# Patient Record
Sex: Female | Born: 1970 | Race: Black or African American | Hispanic: No | Marital: Single | Smoking: Current every day smoker
Health system: Southern US, Community
[De-identification: ages and names within clinical notes are randomized; demographics above are authoritative.]

## PROBLEM LIST (undated history)

## (undated) DIAGNOSIS — B019 Varicella without complication: Secondary | ICD-10-CM

## (undated) DIAGNOSIS — N841 Polyp of cervix uteri: Secondary | ICD-10-CM

## (undated) DIAGNOSIS — D649 Anemia, unspecified: Secondary | ICD-10-CM

## (undated) DIAGNOSIS — K921 Melena: Secondary | ICD-10-CM

## (undated) DIAGNOSIS — N920 Excessive and frequent menstruation with regular cycle: Secondary | ICD-10-CM

## (undated) DIAGNOSIS — E282 Polycystic ovarian syndrome: Secondary | ICD-10-CM

## (undated) DIAGNOSIS — N840 Polyp of corpus uteri: Secondary | ICD-10-CM

## (undated) HISTORY — PX: OTHER SURGICAL HISTORY: SHX169

## (undated) HISTORY — DX: Polycystic ovarian syndrome: E28.2

## (undated) HISTORY — DX: Melena: K92.1

## (undated) HISTORY — DX: Polyp of cervix uteri: N84.1

## (undated) HISTORY — DX: Polyp of corpus uteri: N84.0

## (undated) HISTORY — DX: Anemia, unspecified: D64.9

## (undated) HISTORY — DX: Excessive and frequent menstruation with regular cycle: N92.0

## (undated) HISTORY — DX: Varicella without complication: B01.9

## (undated) HISTORY — DX: Morbid (severe) obesity due to excess calories: E66.01

---

## 2000-09-17 ENCOUNTER — Encounter: Payer: Self-pay | Admitting: Emergency Medicine

## 2000-09-17 ENCOUNTER — Emergency Department (HOSPITAL_COMMUNITY): Admission: EM | Admit: 2000-09-17 | Discharge: 2000-09-17 | Payer: Self-pay | Admitting: Emergency Medicine

## 2000-10-23 ENCOUNTER — Encounter: Admission: RE | Admit: 2000-10-23 | Discharge: 2001-01-21 | Payer: Self-pay | Admitting: Family Medicine

## 2000-12-08 ENCOUNTER — Encounter: Payer: Self-pay | Admitting: Emergency Medicine

## 2000-12-08 ENCOUNTER — Emergency Department (HOSPITAL_COMMUNITY): Admission: EM | Admit: 2000-12-08 | Discharge: 2000-12-08 | Payer: Self-pay | Admitting: Emergency Medicine

## 2001-02-26 ENCOUNTER — Other Ambulatory Visit: Admission: RE | Admit: 2001-02-26 | Discharge: 2001-02-26 | Payer: Self-pay | Admitting: *Deleted

## 2001-12-18 ENCOUNTER — Other Ambulatory Visit: Admission: RE | Admit: 2001-12-18 | Discharge: 2001-12-18 | Payer: Self-pay | Admitting: *Deleted

## 2002-02-12 ENCOUNTER — Ambulatory Visit (HOSPITAL_COMMUNITY): Admission: RE | Admit: 2002-02-12 | Discharge: 2002-02-12 | Payer: Self-pay | Admitting: *Deleted

## 2002-02-12 ENCOUNTER — Encounter (INDEPENDENT_AMBULATORY_CARE_PROVIDER_SITE_OTHER): Payer: Self-pay

## 2004-03-19 ENCOUNTER — Other Ambulatory Visit: Admission: RE | Admit: 2004-03-19 | Discharge: 2004-03-19 | Payer: Self-pay | Admitting: Gynecology

## 2004-05-11 ENCOUNTER — Ambulatory Visit (HOSPITAL_COMMUNITY): Admission: RE | Admit: 2004-05-11 | Discharge: 2004-05-11 | Payer: Self-pay | Admitting: Obstetrics and Gynecology

## 2004-05-11 ENCOUNTER — Encounter (INDEPENDENT_AMBULATORY_CARE_PROVIDER_SITE_OTHER): Payer: Self-pay | Admitting: *Deleted

## 2005-03-29 ENCOUNTER — Other Ambulatory Visit: Admission: RE | Admit: 2005-03-29 | Discharge: 2005-03-29 | Payer: Self-pay | Admitting: Obstetrics and Gynecology

## 2006-04-11 ENCOUNTER — Other Ambulatory Visit: Admission: RE | Admit: 2006-04-11 | Discharge: 2006-04-11 | Payer: Self-pay | Admitting: Obstetrics and Gynecology

## 2008-03-31 ENCOUNTER — Emergency Department (HOSPITAL_COMMUNITY): Admission: EM | Admit: 2008-03-31 | Discharge: 2008-03-31 | Payer: Self-pay | Admitting: Emergency Medicine

## 2009-03-01 ENCOUNTER — Emergency Department (HOSPITAL_COMMUNITY): Admission: EM | Admit: 2009-03-01 | Discharge: 2009-03-01 | Payer: Self-pay | Admitting: Emergency Medicine

## 2011-02-22 NOTE — Op Note (Signed)
[facility]  Patient:    [name], DOUBLE Visit Number: [id number] MRN: [id number]          Service Type: DSU Location: [facility] Attending Physician:  Wetzel Bjornstad Dictated by:   Katy Fitch, M.D. Proc. Date: 02/12/02 Admit Date:  02/12/2002 Discharge Date: 02/12/2002                             Operative Report  PREOPERATIVE DIAGNOSES:       1. Menorrhagia.                               2. Endometrial defect.  POSTOPERATIVE DIAGNOSES:      1. Menorrhagia.                               2. Endometrial defect.                               3. Uterine polyps.  PROCEDURE:                    1. Hysteroscopic resection of uterine polyps.                               2. Suction dilatation and curettage.  SURGEON:                      Katy Fitch, M.D.  ASSIST:                       Gaetano Hawthorne. Lily Peer, M.D.  ANESTHESIA:                   General.  ESTIMATED BLOOD LOSS:         Minimal.  URINE OUTPUT:                 100.  FLUIDS:                       800 Crystalloid.  Hysteroscopic fluid deficit 500 with most being on the floor.  COMPLICATIONS:                None.  SPECIMEN:                     1. Uterine polyps.                               2. Endometrial curettings.  FINDINGS:                     Uterus sounded approximately 7 cm with a markedly angled cervix.  Two anterior polyps approximately 0.5 cm in diameter each and one posterior polyp similar size.  PROCEDURE:                    Patient was taken to the operating room where general anesthesia was administered.  The patient was placed in [place] stirrups and prepped and draped in normal sterile fashion.  A Red rubber catheter was introduced in the bladder and 100 cc of clear urine  was returned.  A bivalve speculum was placed in the vagina, then the anterior lip of the cervix was grasped with a single tooth tenaculum.  The previous laminaria was then removed and the cervix  was then serially dilated to approximately 31 [place]. After several attempts of placing the hysteroscope into the cervix there was noted to be an angle at the cervix which was difficult to navigate.  At this point I asked Dr. Lily Peer to try to help manipulate the cervix and get the scope into the uterine fundus.  At this point he was able to maneuver the scope into the uterine fundus without any difficulties or perforation.  At this point the fluid was placed at 80 cc/minute which was Sorbitol.  At this point the cavity was noted to be cleared of blood and the uterine polyps were then noted, two on the anterior surface and one on the posterior surface. With a 90 degree wire loop at an 80 cut the two anterior polyps were removed and the resectoscope was then removed from the field of operation and specimens placed on Telfa paper.  The scope was then placed back into the uterine fundus and the posterior polyp was then removed with the 90 degree wire loop and hemostasis was noted.  After this procedure the patient had a sharp curette of the endometrial cavity and the rest of the uterine contents were suctioned with a #7 straight catheter tip and sent as a separate specimen.  Hemostasis was noted and the single tooth tenaculum was removed and the patient was then taken to recovery room in stable condition. Dictated by:   Katy Fitch, M.D. Attending Physician:  Wetzel Bjornstad DD:  02/12/02 TD:  02/15/02 Job: [id number]

## 2011-02-22 NOTE — H&P (Signed)
[facility]  PatientVIOLETA, [name] Visit Number: [id number] MRN: [id number]          Service Type: Attending:  Katy Fitch, M.D. Dictated by:   Katy Fitch, M.D. Adm. Date:  02/11/02                           History and Physical  CHIEF COMPLAINT:              Abnormal uterine bleeding.  HISTORY OF PRESENT ILLNESS:   The patient is a 40 year old, G0, who showed up to the office on December 18, 2001, for annual examination and also complained of heavy vaginal bleeding.  The patient states that she started her periods at age 47 and had irregular bleeding for the first six years.  The patient denied any other problems and has not been on any type of birth control.  The patient underwent endometrial biopsy in the office showing a benign endometrial polyp and strips of benign endometrium.  A saline infusion sonogram showed anterior and posterior defects in the endometrial cavity suggestive of uterine polyps. The patient was then put on Lupron Depot to thin her lining and schedule for resection of uterine polyps.  PAST MEDICAL HISTORY:         None.  PAST SURGICAL HISTORY:        None.  MEDICATIONS:                  None.  ALLERGIES:                    No known drug allergies.  SOCIAL HISTORY:               Without any tobacco, alcohol, or drugs.  FAMILY HISTORY:               Without any mental retardation or epithelial cancers.  PHYSICAL EXAMINATION:         Blood pressure 130/70.  HEENT:                        Throat clear.  LUNGS:                        Clear to auscultation bilaterally.  HEART:                        Regular rate and rhythm.  ABDOMEN:                      Obese and nontender.  No palpable masses.  EXTREMITIES:                  Without any clubbing, cyanosis, edema, or tenderness.  PELVIC:                       Normal external genitalia.  Cervix without any gross lesions.  Uterus and adnexa difficult to  palpate secondary to size.  ASSESSMENT AND PLAN:          A 40 year old G0 with menorrhagia and probable endometrial polyps.  The patient is scheduled for endometrial resection of uterine polyps through a hysteroscope.  The patient was given laminaria on Feb 11, 2002, for mechanical dilation of her cervix.  The patient was explained the risks and benefits of the procedure, including bleeding,  transfusion, hepatitis, HIV, scar tissue, scar tissue on the inside lining leading to potential infertility, damage to internal organs, potential uterine perforation which would need possible further exploratory surgery to fix the damage, fistula formation, anesthetic complications, blood clots, stroke, pulmonary embolism, myocardial infarction, or death.  Also, the patient was explained that she could also developed electrolyte abnormalities due to the fluid absorption of the hysteroscope which could also potentially lead to seizures.  The patient states that she understands these risks and all of her questions were answered to her satisfaction. Dictated by:   Katy Fitch, M.D. Attending:  Katy Fitch, M.D. DD:  02/11/02 TD:  02/11/02 Job: [id number]

## 2011-02-22 NOTE — Op Note (Signed)
[name], [name]                            ACCOUNT NO.:  [id number]   MEDICAL RECORD NO.:  [id number]                   PATIENT TYPE:  AMB   LOCATION:  SDC                                  FACILITY:  [facility]   PHYSICIAN:  Daniel L. Eda Paschal, M.D.           DATE OF BIRTH:  Jul 13, 1971   DATE OF PROCEDURE:  05/11/2004  DATE OF DISCHARGE:  05/11/2004                                 OPERATIVE REPORT   PREOPERATIVE DIAGNOSIS:  Menorrhalgia with endometrial polyp.   POSTOPERATIVE DIAGNOSIS:  Menorrhagia with endometrial polyp.   OPERATION:  Hysteroscopy, dilatation and curettage, removal of polyp.   SURGEON:  Daniel L. Eda Paschal, M.D.   ANESTHESIA:  General.   INDICATIONS:  This patient is a 40 year old gravida 0, para 0, who had  presented to the office with severe menorrhagia.  Bleeding was finally  stopped with Progestin, but SIH was done because of a history of a previous  endometrial polyp several years ago and on SIH she had multiple small  filling defects in her uterus.  She now enters the hospital for  hysteroscopy, D&C.   PROCEDURE:  The patient was taken to the operating room and given general  anesthesia.  At one point she had a laryngospasm.  Her anesthesia was  finally controlled and then she was placed in the dorsal lithotomy position  and prepped and draped in the usual sterile manner.  A single-tooth  tenaculum was placed on the anterior lip of the cervix.  The cervix could be  with a lot of difficulty dilated to a #1 Pratt dilator.  The reason there  was difficulty was because of the anatomy due to her heaviness, she weighed  over 400 pounds and difficulty in terms of navigating through her cervix  into the uterus.  After this was done, an attempt was placed with the  hysteroscope but because of the long length of her vagina and the size of  the resectoscope, you could not get a resectoscope through the cervical  canal in a nontraumatic fashion.  Therefore a  diagnostic hysteroscope was  used, 3% sorbitol was used to expand the intrauterine cavity.  The patient  had multiple small polyps and a large build-up of endometrium __________,  but no large defects were seen, just the multiple defects described above.  The hysteroscope was removed.  Curettage was done with three different-sized  curettes.  Three or four polypoid-type segments were removed along with all  the endometrium.  She was re-hysteroscoped and she now had a clean  endometrial cavity.  The procedure was terminated.  All tissue was sent to  pathology for tissue diagnosis.  Estimated blood loss was 30 mL with none  replaced.  Fluid deficit was 20 mL of the sorbitol.  Please note that a  camera had been used with the hysteroscope.  The patient left the operating  room in satisfactory condition.  Daniel L. Eda Paschal, M.D.    Tonette Bihari  D:  05/11/2004  T:  05/13/2004  Job:  [id number]

## 2011-04-01 ENCOUNTER — Encounter (INDEPENDENT_AMBULATORY_CARE_PROVIDER_SITE_OTHER): Payer: 59 | Admitting: Women's Health

## 2011-04-01 DIAGNOSIS — N912 Amenorrhea, unspecified: Secondary | ICD-10-CM

## 2011-04-01 DIAGNOSIS — Z01419 Encounter for gynecological examination (general) (routine) without abnormal findings: Secondary | ICD-10-CM

## 2011-04-01 DIAGNOSIS — Z833 Family history of diabetes mellitus: Secondary | ICD-10-CM

## 2011-04-01 DIAGNOSIS — N926 Irregular menstruation, unspecified: Secondary | ICD-10-CM

## 2011-04-17 ENCOUNTER — Other Ambulatory Visit: Payer: 59

## 2011-04-17 ENCOUNTER — Ambulatory Visit: Payer: 59 | Admitting: Women's Health

## 2011-04-22 ENCOUNTER — Other Ambulatory Visit: Payer: 59

## 2011-04-22 ENCOUNTER — Ambulatory Visit: Payer: 59 | Admitting: Women's Health

## 2011-04-24 ENCOUNTER — Other Ambulatory Visit: Payer: 59

## 2011-04-24 ENCOUNTER — Ambulatory Visit (INDEPENDENT_AMBULATORY_CARE_PROVIDER_SITE_OTHER): Payer: 59 | Admitting: Women's Health

## 2011-04-24 DIAGNOSIS — E282 Polycystic ovarian syndrome: Secondary | ICD-10-CM

## 2011-04-24 DIAGNOSIS — L909 Atrophic disorder of skin, unspecified: Secondary | ICD-10-CM

## 2011-04-24 DIAGNOSIS — L919 Hypertrophic disorder of the skin, unspecified: Secondary | ICD-10-CM

## 2011-04-24 DIAGNOSIS — N912 Amenorrhea, unspecified: Secondary | ICD-10-CM

## 2011-07-04 LAB — CBC
HCT: 42.7
Hemoglobin: 14.1
MCHC: 33.1
MCV: 79
Platelets: 246
RBC: 5.4 — ABNORMAL HIGH
RDW: 16.7 — ABNORMAL HIGH
WBC: 12 — ABNORMAL HIGH

## 2011-07-04 LAB — POCT I-STAT, CHEM 8
BUN: 14
Calcium, Ion: 1.12
Chloride: 105
Creatinine, Ser: 1
Glucose, Bld: 83
HCT: 45
Hemoglobin: 15.3 — ABNORMAL HIGH
Potassium: 4.1
Sodium: 138
TCO2: 26

## 2011-07-04 LAB — DIFFERENTIAL
Basophils Absolute: 0
Basophils Relative: 0
Eosinophils Absolute: 0.4
Eosinophils Relative: 3
Lymphocytes Relative: 21
Lymphs Abs: 2.6
Monocytes Absolute: 0.7
Monocytes Relative: 6
Neutro Abs: 8.3 — ABNORMAL HIGH
Neutrophils Relative %: 69

## 2011-07-04 LAB — SAMPLE TO BLOOD BANK

## 2011-07-04 LAB — OCCULT BLOOD X 1 CARD TO LAB, STOOL: Fecal Occult Bld: POSITIVE

## 2011-08-19 ENCOUNTER — Encounter: Payer: Self-pay | Admitting: Family Medicine

## 2011-08-19 ENCOUNTER — Ambulatory Visit (INDEPENDENT_AMBULATORY_CARE_PROVIDER_SITE_OTHER): Payer: 59 | Admitting: Family Medicine

## 2011-08-19 ENCOUNTER — Telehealth: Payer: Self-pay | Admitting: *Deleted

## 2011-08-19 VITALS — BP 110/65 | HR 73 | Temp 98.7°F | Ht 64.5 in | Wt 386.6 lb

## 2011-08-19 DIAGNOSIS — R519 Headache, unspecified: Secondary | ICD-10-CM | POA: Insufficient documentation

## 2011-08-19 DIAGNOSIS — R51 Headache: Secondary | ICD-10-CM | POA: Insufficient documentation

## 2011-08-19 NOTE — Telephone Encounter (Signed)
Error

## 2011-08-19 NOTE — Progress Notes (Signed)
  Subjective:    Patient ID: [name], female    DOB: [birth year], 40 y.o.   MRN: [id number]  HPI New to establish.  Previous MD- Allyne Gee, was dismissed due to no shows.  GYN- [facility], [facility].  HAs- pt reports HAs started 'all of a sudden' at work.  Took ibuprofen and sxs improved.  No relief w/ tylenol.  HAs occuring daily until appt was made on 11/6- has not had one since.  sxs started 3 weeks ago.  HAs were frontal.  No nausea, visual changes, dizziness.  Denies nasal congestion.  No hx of seasonal allergies.  No ear pain.  No hx of similar.   Review of Systems For ROS see HPI     Objective:   Physical Exam  Vitals reviewed. Constitutional: She is oriented to person, place, and time. She appears well-developed and well-nourished. No distress.  HENT:  Head: Normocephalic and atraumatic.  Right Ear: Tympanic membrane normal.  Left Ear: Tympanic membrane normal.  Nose: Mucosal edema and rhinorrhea present. Right sinus exhibits no maxillary sinus tenderness and no frontal sinus tenderness. Left sinus exhibits no maxillary sinus tenderness and no frontal sinus tenderness.  Mouth/Throat: Mucous membranes are normal. Posterior oropharyngeal erythema (w/ PND) present.  Eyes: Conjunctivae and EOM are normal. Pupils are equal, round, and reactive to light.  Neck: Normal range of motion. Neck supple.  Cardiovascular: Normal rate, regular rhythm and normal heart sounds.   Pulmonary/Chest: Effort normal and breath sounds normal. No respiratory distress. She has no wheezes. She has no rales.  Lymphadenopathy:    She has no cervical adenopathy.  Neurological: She is alert and oriented to person, place, and time. She has normal reflexes. No cranial nerve deficit. Coordination normal.          Assessment & Plan:

## 2011-08-19 NOTE — Patient Instructions (Signed)
Schedule your complete physical at your convenience- do not eat before this exam Your headaches appear to be all sinus related Start an OTC sinus decongestant and/or a claritin daily Drink plenty of fluids Ibuprofen as needed Call with any questions or concerns Welcome!  We're glad to have you!

## 2011-08-25 ENCOUNTER — Encounter: Payer: Self-pay | Admitting: Family Medicine

## 2011-08-25 NOTE — Assessment & Plan Note (Signed)
Pt's HAs appear to be related to untreated nasal allergies/sinus congestion.  No red flags on hx or PE.  Reviewed supportive care and red flags that should prompt return.  Pt expressed understanding and is in agreement w/ plan.

## 2011-09-04 ENCOUNTER — Telehealth: Payer: Self-pay

## 2011-09-04 NOTE — Telephone Encounter (Signed)
PT STATES SHE STILL IS NOT HAVING REGULAR PERIODS AND IS INTERESTED IN CONCEIVING. WANTS TO KNOW WHAT THE NEXT STEP?

## 2011-09-04 NOTE — Telephone Encounter (Signed)
Telephone call, continues to have long periods of amenorrhea, history of PCO S., new partner. Instructed to schedule office visit for STD screen and will discuss conception. Partner 49 and has 4 children.

## 2011-09-11 ENCOUNTER — Encounter: Payer: 59 | Admitting: Family Medicine

## 2011-09-16 ENCOUNTER — Encounter: Payer: Self-pay | Admitting: Women's Health

## 2011-09-16 ENCOUNTER — Ambulatory Visit (INDEPENDENT_AMBULATORY_CARE_PROVIDER_SITE_OTHER): Payer: 59 | Admitting: Women's Health

## 2011-09-16 DIAGNOSIS — N912 Amenorrhea, unspecified: Secondary | ICD-10-CM

## 2011-09-16 MED ORDER — METFORMIN HCL 500 MG PO TABS: 500.0000 mg | ORAL_TABLET | Freq: Two times a day (BID) | ORAL | Status: DC

## 2011-09-16 MED ORDER — MEDROXYPROGESTERONE ACETATE 10 MG PO TABS: 10.0000 mg | ORAL_TABLET | Freq: Every day | ORAL | Status: DC

## 2011-09-16 NOTE — Progress Notes (Signed)
Patient ID: [name], female   DOB: November 05, 1970, 40 y.o.   MRN: [id number] Presents to discuss amenorrhea and conception. Had annual exam  June, 2012 with problem of amenorrhea and morbid obesity. Normal TSH, FSH and glucose 90. Ultrasound showed right ovary had numerous follicles and left ovary with tiny follicles with one dominant follicle. Was given Provera in July for 5 days and did have a 6 day cycle after. Having intercourse 2-3 times per week. Partner has 4 children.  Reviewed importance of weight loss, had tried metformin in the past but did have diarrhea. Will start back on metformin 500 daily increase to twice a day. Qual and prolactin today. If Qual negative withdrawal with Provera 10 for 5 days, prescription given and instructed not to take until qual results available. Instructed to schedule appointment with  Dr.Yalcinkaya to discuss fertility management.

## 2011-09-18 ENCOUNTER — Telehealth: Payer: Self-pay | Admitting: Women's Health

## 2011-09-18 NOTE — Telephone Encounter (Signed)
Telephone call, left message on cell per request. Blood pregnancy test negative and prolactin normal. Instructed to take Provera to make her get a cycle, instructed to call if no cycle after Provera.

## 2011-09-25 ENCOUNTER — Telehealth: Payer: Self-pay | Admitting: *Deleted

## 2011-09-25 NOTE — Telephone Encounter (Signed)
Pt called stating she took the provera 10 mg as directed and no period yet. Pt says last time she took medication her period came on after taking second dose. I told pt to give a little more time to see if period will start. Pt agreed and will follow up as needed.

## 2011-10-14 ENCOUNTER — Telehealth: Payer: Self-pay | Admitting: *Deleted

## 2011-10-14 NOTE — Telephone Encounter (Signed)
Lm for pt to call

## 2011-10-14 NOTE — Telephone Encounter (Signed)
Please call and inform to take with breakfast and dinner with food.  Also ask if she got a cycle after provera.  Has she scheduled appt with fertility Dr.?

## 2011-10-14 NOTE — Telephone Encounter (Signed)
Pt was told to start on metformin 500 mg daily twice a day on 09/16/11. Pt called wanting to know if she could take both pills at the same time? Pt said she doesn't eat much and was told to take medication with food. Please advise

## 2011-10-14 NOTE — Telephone Encounter (Signed)
Pt informed with the below note. She did get her period after taking provera and she has appointment this month with fertility doctor.

## 2011-10-15 ENCOUNTER — Encounter: Payer: Self-pay | Admitting: Family Medicine

## 2011-10-15 ENCOUNTER — Ambulatory Visit (INDEPENDENT_AMBULATORY_CARE_PROVIDER_SITE_OTHER): Payer: 59 | Admitting: Family Medicine

## 2011-10-15 DIAGNOSIS — Z0000 Encounter for general adult medical examination without abnormal findings: Secondary | ICD-10-CM | POA: Insufficient documentation

## 2011-10-15 DIAGNOSIS — E669 Obesity, unspecified: Secondary | ICD-10-CM | POA: Insufficient documentation

## 2011-10-15 LAB — HEPATIC FUNCTION PANEL
AST: 12 U/L (ref 0–37)
Albumin: 3.5 g/dL (ref 3.5–5.2)
Total Protein: 7.2 g/dL (ref 6.0–8.3)

## 2011-10-15 LAB — CBC WITH DIFFERENTIAL/PLATELET
Basophils Relative: 0.4 % (ref 0.0–3.0)
Eosinophils Relative: 1.1 % (ref 0.0–5.0)
HCT: 39.3 % (ref 36.0–46.0)
Lymphs Abs: 2.3 10*3/uL (ref 0.7–4.0)
MCV: 79.9 fl (ref 78.0–100.0)
Monocytes Absolute: 0.4 10*3/uL (ref 0.1–1.0)
RBC: 4.92 Mil/uL (ref 3.87–5.11)
WBC: 7.8 10*3/uL (ref 4.5–10.5)

## 2011-10-15 LAB — BASIC METABOLIC PANEL
BUN: 7 mg/dL (ref 6–23)
CO2: 30 mEq/L (ref 19–32)
Calcium: 8.6 mg/dL (ref 8.4–10.5)
GFR: 112.45 mL/min (ref >60.00)
Glucose, Bld: 88 mg/dL (ref 70–99)
Potassium: 3.9 mEq/L (ref 3.5–5.1)

## 2011-10-15 LAB — LIPID PANEL: HDL: 41.2 mg/dL (ref >39.00)

## 2011-10-15 LAB — TSH: TSH: 1.93 u[IU]/mL (ref 0.35–5.50)

## 2011-10-15 NOTE — Progress Notes (Signed)
  Subjective:    Patient ID: [name], female    DOB: [birth year], 41 y.o.   MRN: [id number]  HPI CPE- UTD on GYN ([facility]).  No concerns today.   Review of Systems Patient reports no vision/ hearing changes, adenopathy,fever, weight change,  persistant/recurrent hoarseness , swallowing issues, chest pain, palpitations, edema, persistant/recurrent cough, hemoptysis, dyspnea (rest/exertional/paroxysmal nocturnal), gastrointestinal bleeding (melena, rectal bleeding), abdominal pain, significant heartburn, bowel changes, GU symptoms (dysuria, hematuria, incontinence), Gyn symptoms (abnormal  bleeding, pain),  syncope, focal weakness, memory loss, numbness & tingling, skin/hair/nail changes, abnormal bruising or bleeding, anxiety, or depression.     Objective:   Physical Exam General Appearance:    Alert, cooperative, no distress, appears stated age, morbidly obese  Head:    Normocephalic, without obvious abnormality, atraumatic  Eyes:    PERRL, conjunctiva/corneas clear, EOM's intact, fundi    benign, both eyes  Ears:    Normal TM's and external ear canals, both ears  Nose:   Nares normal, septum midline, mucosa normal, no drainage    or sinus tenderness  Throat:   Lips, mucosa, and tongue normal; teeth and gums normal  Neck:   Supple, symmetrical, trachea midline, no adenopathy;    Thyroid: no enlargement/tenderness/nodules  Back:     Symmetric, no curvature, ROM normal, no CVA tenderness  Lungs:     Clear to auscultation bilaterally, respirations unlabored  Chest Wall:    No tenderness or deformity   Heart:    Regular rate and rhythm, S1 and S2 normal, no murmur, rub   or gallop  Breast Exam:    Deferred to GYN  Abdomen:     Soft, non-tender, bowel sounds active all four quadrants,    no masses, no organomegaly  Genitalia:    Deferred to GYN  Rectal:    Extremities:   Extremities normal, atraumatic, no cyanosis or edema  Pulses:   2+ and symmetric all extremities  Skin:    Skin color, texture, turgor normal, no rashes or lesions  Lymph nodes:   Cervical, supraclavicular, and axillary nodes normal  Neurologic:   CNII-XII intact, normal strength, sensation and reflexes    throughout          Assessment & Plan:

## 2011-10-15 NOTE — Patient Instructions (Signed)
Follow up in 6 months or as needed We'll notify you of your lab results QUIT SMOKING!!! Try and get regular exercise and focus on healthy diet- a health you means a healthy pregnancy Call with any questions or concerns Happy New Year!! GOOD LUCK!!!

## 2011-10-16 ENCOUNTER — Encounter: Payer: Self-pay | Admitting: *Deleted

## 2011-10-17 LAB — VITAMIN D 1,25 DIHYDROXY
Vitamin D 1, 25 (OH)2 Total: 46 pg/mL (ref 18–72)
Vitamin D2 1, 25 (OH)2: <8 pg/mL

## 2011-10-18 ENCOUNTER — Encounter: Payer: Self-pay | Admitting: *Deleted

## 2011-10-22 NOTE — Assessment & Plan Note (Signed)
Stressed importance of healthy diet and regular exercise- especially since pt is now interested in becoming pregnant.  Check labs to risk stratify.  Will follow.

## 2011-10-22 NOTE — Assessment & Plan Note (Signed)
Pt's PE WNL w/ exception of obesity.  UTD on GYN.  Check labs.  Anticipatory guidance provided.  

## 2012-03-13 ENCOUNTER — Emergency Department (HOSPITAL_COMMUNITY)
Admission: EM | Admit: 2012-03-13 | Discharge: 2012-03-13 | Disposition: A | Discharge status: Home / Self Care | Payer: 59 | Attending: Emergency Medicine | Admitting: Emergency Medicine

## 2012-03-13 ENCOUNTER — Encounter (HOSPITAL_COMMUNITY): Payer: Self-pay

## 2012-03-13 DIAGNOSIS — K219 Gastro-esophageal reflux disease without esophagitis: Secondary | ICD-10-CM | POA: Insufficient documentation

## 2012-03-13 LAB — DIFFERENTIAL
Basophils Absolute: 0 10*3/uL (ref 0.0–0.1)
Basophils Relative: 0 % (ref 0–1)
Eosinophils Absolute: 0.1 10*3/uL (ref 0.0–0.7)
Eosinophils Relative: 1 % (ref 0–5)
Monocytes Absolute: 0.6 10*3/uL (ref 0.1–1.0)
Monocytes Relative: 7 % (ref 3–12)

## 2012-03-13 LAB — COMPREHENSIVE METABOLIC PANEL
ALT: 8 U/L (ref 0–35)
AST: 12 U/L (ref 0–37)
Albumin: 3.3 g/dL — ABNORMAL LOW (ref 3.5–5.2)
Alkaline Phosphatase: 87 U/L (ref 39–117)
Calcium: 9 mg/dL (ref 8.4–10.5)
GFR calc Af Amer: >90 mL/min (ref >90)
Glucose, Bld: 82 mg/dL (ref 70–99)
Potassium: 3.8 mEq/L (ref 3.5–5.1)
Sodium: 139 mEq/L (ref 135–145)
Total Protein: 6.9 g/dL (ref 6.0–8.3)

## 2012-03-13 LAB — CBC
HCT: 39.9 % (ref 36.0–46.0)
Hemoglobin: 12.8 g/dL (ref 12.0–15.0)
MCH: 25.8 pg — ABNORMAL LOW (ref 26.0–34.0)
MCHC: 32.1 g/dL (ref 30.0–36.0)
MCV: 80.3 fL (ref 78.0–100.0)
RDW: 15.7 % — ABNORMAL HIGH (ref 11.5–15.5)

## 2012-03-13 MED ORDER — OXYCODONE-ACETAMINOPHEN 5-325 MG PO TABS: 2.0000 | ORAL_TABLET | ORAL | Status: AC | PRN

## 2012-03-13 MED ORDER — PANTOPRAZOLE SODIUM 20 MG PO TBEC: 20.0000 mg | DELAYED_RELEASE_TABLET | Freq: Every day | ORAL | Status: DC

## 2012-03-13 NOTE — ED Provider Notes (Addendum)
History     CSN: [id number]  Arrival date & time 03/13/12  1250   First MD Initiated Contact with Patient 03/13/12 1347      Chief Complaint  Patient presents with  . Chest Pain    (Consider location/radiation/quality/duration/timing/severity/associated sxs/prior treatment) Patient is a 41 y.o. female presenting with chest pain. The history is provided by the patient.  Chest Pain Pertinent negatives for primary symptoms include no fever, no shortness of breath, no cough, no abdominal pain, no nausea and no vomiting.    the patient is a 42 year old, super, morbidly obese, female, who complains of substernal chest pain since approximately 11:30 this morning.  She denies nausea, vomiting, sweating, shortness of breath.  She denies cough, fevers, or chills.  She denies leg pain or swelling.  She does smoke cigarettes.  She denies diabetes, prior history of myocardial infarction, or family history of premature heart attack.  Her pain has been constant.  She states if she eats.  The pain increases.  Past Medical History  Diagnosis Date  . PCOS (polycystic ovarian syndrome)   . Menorrhagia   . Morbid obesity   . Anemia   . Endometrial polyp 2003 and 2005  . Blood in stool   . Chicken pox   . Polyp cervix removed twice    History reviewed. No pertinent past surgical history.  Family History  Problem Relation Age of Onset  . Hypertension Mother   . Hypertension Maternal Grandmother     History  Substance Use Topics  . Smoking status: Current Everyday Smoker -- 1.0 packs/day  . Smokeless tobacco: Never Used  . Alcohol Use: Yes    OB History    Grav Para Term Preterm Abortions TAB SAB Ect Mult Living                  Review of Systems  Constitutional: Negative for fever and chills.  Respiratory: Negative for cough and shortness of breath.   Cardiovascular: Positive for chest pain.  Gastrointestinal: Negative for nausea, vomiting and abdominal pain.  Neurological:  Negative for headaches.  Psychiatric/Behavioral: Negative for confusion.  All other systems reviewed and are negative.    Allergies  Review of patient's allergies indicates no known allergies.  Home Medications  No current outpatient prescriptions on file.  BP 127/84  Pulse 73  Temp(Src) 98 F (36.7 C) (Oral)  Resp 16  Ht 5\' 4"  (1.626 m)  Wt 385 lb (174.635 kg)  BMI 66.09 kg/m2  SpO2 100%  LMP 03/14/2011  Physical Exam  Nursing note and vitals reviewed. Constitutional: She is oriented to person, place, and time. No distress.       Super morbidly obese  HENT:  Head: Normocephalic and atraumatic.  Eyes: Conjunctivae and EOM are normal.  Neck: Normal range of motion.  Cardiovascular: Normal rate.   No murmur heard. Pulmonary/Chest: Effort normal. No respiratory distress.  Abdominal: Soft. She exhibits no distension. There is tenderness.       Mild epigastric abd pain  Musculoskeletal: Normal range of motion.  Neurological: She is alert and oriented to person, place, and time.  Skin: Skin is warm.  Psychiatric: She has a normal mood and affect. Thought content normal.    ED Course  Procedures (including critical care time)   Labs Reviewed  COMPREHENSIVE METABOLIC PANEL  CBC  DIFFERENTIAL   No results found.   No diagnosis found.    Rate: 71  Rhythm: normal sinus rhythm  QRS Axis: normal  Intervals:  normal  ST/T Wave abnormalities: normal  Conduction Disutrbances: none  Narrative Interpretation: unremarkable      MDM  Probable gerd No evidence acs No resp distress or toxicity.        Cheri Guppy, MD 03/13/12 1610  Cheri Guppy, MD 06/17/12 [phone]

## 2012-03-13 NOTE — Discharge Instructions (Signed)
Your EKG, and blood tests do not show any signs of damage to your heart or irregular heartbeat.  Your blood tests are normal.  Use Protonix to reduce acid in her stomach to control pain.  Use Percocet for more severe pain.  Followup with your Dr. for reevaluation.  Return for worse or uncontrolled symptoms

## 2012-03-13 NOTE — ED Notes (Signed)
Pt c/o of centralized chest pain that started this morning. She describes the pain as squeezing. Denies any SOB, indigestion, pain radiation or vomiting. Has no hx of heart disease. Is a current smoker.

## 2012-04-01 ENCOUNTER — Encounter: Payer: 59 | Admitting: Women's Health

## 2012-06-10 ENCOUNTER — Ambulatory Visit: Payer: 59 | Admitting: Family Medicine

## 2012-11-11 ENCOUNTER — Encounter: Payer: 59 | Admitting: Women's Health

## 2013-01-01 ENCOUNTER — Ambulatory Visit (INDEPENDENT_AMBULATORY_CARE_PROVIDER_SITE_OTHER): Payer: 59 | Admitting: Women's Health

## 2013-01-01 ENCOUNTER — Encounter: Payer: Self-pay | Admitting: Women's Health

## 2013-01-01 ENCOUNTER — Other Ambulatory Visit: Payer: Self-pay | Admitting: Women's Health

## 2013-01-01 VITALS — BP 140/84 | Ht 63.5 in | Wt 350.0 lb

## 2013-01-01 DIAGNOSIS — E282 Polycystic ovarian syndrome: Secondary | ICD-10-CM

## 2013-01-01 DIAGNOSIS — Z01419 Encounter for gynecological examination (general) (routine) without abnormal findings: Secondary | ICD-10-CM

## 2013-01-01 DIAGNOSIS — N899 Noninflammatory disorder of vagina, unspecified: Secondary | ICD-10-CM

## 2013-01-01 DIAGNOSIS — Z1231 Encounter for screening mammogram for malignant neoplasm of breast: Secondary | ICD-10-CM

## 2013-01-01 DIAGNOSIS — N912 Amenorrhea, unspecified: Secondary | ICD-10-CM

## 2013-01-01 DIAGNOSIS — Z113 Encounter for screening for infections with a predominantly sexual mode of transmission: Secondary | ICD-10-CM

## 2013-01-01 DIAGNOSIS — N898 Other specified noninflammatory disorders of vagina: Secondary | ICD-10-CM

## 2013-01-01 LAB — WET PREP FOR TRICH, YEAST, CLUE: WBC, Wet Prep HPF POC: NONE SEEN

## 2013-01-01 MED ORDER — METRONIDAZOLE 500 MG PO TABS: 500.0000 mg | ORAL_TABLET | Freq: Two times a day (BID) | ORAL | Status: DC

## 2013-01-01 NOTE — Patient Instructions (Signed)

## 2013-01-01 NOTE — Progress Notes (Signed)
[name] 23-May-1971 [id number]    History:    The patient presents for annual exam.  History of PCO S. With rare cycles, last cycle 2 weeks ago. States can go 6 months to one year without a cycle. Normal TSH prolactin, lipid panel and CBC. Morbid obesity. Partner currently going through chemotherapy for stomach cancer. First husband died of leukemia with congestive heart failure 06. Endometrial polyps removed in 2003 and 2005. Normal Pap history.   Past medical history, past surgical history, family history and social history were all reviewed and documented in the EPIC chart. Works at [employer] in [employer].   ROS:  A  ROS was performed and pertinent positives and negatives are included in the history.  Exam:  Filed Vitals:   01/01/13 1134  BP: 140/84    General appearance:  Normal Head/Neck:  Normal, without cervical or supraclavicular adenopathy. Thyroid:  Symmetrical, normal in size, without palpable masses or nodularity. Respiratory  Effort:  Normal  Auscultation:  Clear without wheezing or rhonchi Cardiovascular  Auscultation:  Regular rate, without rubs, murmurs or gallops  Edema/varicosities:  Not grossly evident Abdominal  Soft,nontender, without masses, guarding or rebound.  Liver/spleen:  No organomegaly noted  Hernia:  None appreciated  Skin  Inspection:  Grossly normal  Palpation:  Grossly normal Neurologic/psychiatric  Orientation:  Normal with appropriate conversation.  Mood/affect:  Normal  Genitourinary    Breasts: Examined lying and sitting.     Right: Without masses, retractions, discharge or axillary adenopathy.     Left: Without masses, retractions, discharge or axillary adenopathy.   Inguinal/mons:  Normal without inguinal adenopathy  External genitalia:  Normal  BUS/Urethra/Skene's glands:  Normal  Bladder:  Normal  Vagina:  Wet prep positive for amines, clues, many bacteria  Cervix:  Normal  Uterus:   normal in size, shape and contour.   Midline and mobile  Adnexa/parametria:     Rt: Without masses or tenderness.   Lt: Without masses or tenderness.  Anus and perineum: Normal  Digital rectal exam: Normal sphincter tone without palpated masses or tenderness  Assessment/Plan:  42 y.o. WBF G0 for annual exam.     PCO S./amenorrhea Morbid obesity Bacteria vaginosis STD screen per request  Plan: Long discussion on importance of weight loss in relationship to health, increase exercise and decrease calories. SBE's, annual mammogram, breast center number given instructed to schedule. Flagyl 500 twice daily for 7 days, alcohol precautions reviewed instructed to call if no relief of discharge. GC/Chlamydia, Pap, HIV, hep B, C., RPR per request. Conception reviewed, reviewed will need consult with Dr. April Manson to discuss, declines at this time. Has rare intercourse, condoms encouraged. Ultrasound, will schedule, limited exam due to obesity and amenorrhea. Discussed monthly withdrawal, has had thin endometriums with previous ultrasounds.      NOLAH, KRENZER WHNP, 1:17 PM 01/01/2013

## 2013-01-18 ENCOUNTER — Ambulatory Visit (HOSPITAL_COMMUNITY)
Admission: RE | Admit: 2013-01-18 | Discharge: 2013-01-18 | Disposition: A | Discharge status: Home / Self Care | Payer: 59 | Source: Ambulatory Visit | Attending: Women's Health | Admitting: Women's Health

## 2013-01-18 DIAGNOSIS — Z1231 Encounter for screening mammogram for malignant neoplasm of breast: Secondary | ICD-10-CM | POA: Insufficient documentation

## 2013-05-03 ENCOUNTER — Ambulatory Visit: Payer: 59 | Admitting: Women's Health

## 2013-05-03 ENCOUNTER — Other Ambulatory Visit: Payer: 59

## 2013-05-10 ENCOUNTER — Encounter: Payer: Self-pay | Admitting: Women's Health

## 2013-05-10 ENCOUNTER — Ambulatory Visit (INDEPENDENT_AMBULATORY_CARE_PROVIDER_SITE_OTHER): Payer: 59 | Admitting: Women's Health

## 2013-05-10 ENCOUNTER — Ambulatory Visit (INDEPENDENT_AMBULATORY_CARE_PROVIDER_SITE_OTHER): Payer: 59

## 2013-05-10 DIAGNOSIS — N926 Irregular menstruation, unspecified: Secondary | ICD-10-CM

## 2013-05-10 DIAGNOSIS — E282 Polycystic ovarian syndrome: Secondary | ICD-10-CM

## 2013-05-10 DIAGNOSIS — N912 Amenorrhea, unspecified: Secondary | ICD-10-CM

## 2013-05-10 DIAGNOSIS — N8300 Follicular cyst of ovary, unspecified side: Secondary | ICD-10-CM

## 2013-05-10 NOTE — Progress Notes (Signed)
Patient ID: Raelle Chambers, female   DOB: 1971-05-16, 42 y.o.   MRN: [id number] Presents for ultrasound, amenorrhea with irregular cycles, limited exam due to obesity. History of PCO S.,  benign endometrial polyps 2003 in 2005. Cycles are every 3 months, using no contraception, pregnancy okay. Does not want to pursue fertility or prevent pregnancy. Normal TSH and prolactin 2013.  Ultrasound: Anteverted uterus with tri-layered endometrial cavity. Endometrium 5.3 mm. Right and left ovary normal with follicles less than 8 mm, negative cul-de-sac. No abnormalities noted on ovaries or uterus.  Irregular cycles with amenorrhea Morbid obesity Smoker less than a pack per day  Plan: Options reviewed, discussed Mirena IUD, declines at this time. Reviewed importance of returning to office if cycles greater than 3 months for withdraw with Provera.  Reviewed importance of increasing regular exercise and decreasing calories for weight loss. Options reviewed for smoking, declines at this time. Reviewed ovaries appear less polycystic at this time on ultrasound.

## 2013-08-02 ENCOUNTER — Ambulatory Visit: Payer: 59 | Admitting: Family Medicine

## 2013-08-02 DIAGNOSIS — Z0289 Encounter for other administrative examinations: Secondary | ICD-10-CM

## 2013-08-12 ENCOUNTER — Other Ambulatory Visit: Payer: Self-pay

## 2013-12-30 ENCOUNTER — Encounter: Payer: Self-pay | Admitting: Nurse Practitioner

## 2013-12-30 ENCOUNTER — Ambulatory Visit (INDEPENDENT_AMBULATORY_CARE_PROVIDER_SITE_OTHER): Payer: 59 | Admitting: Nurse Practitioner

## 2013-12-30 VITALS — BP 115/70 | HR 83 | Temp 98.2°F | Ht 64.0 in | Wt 392.0 lb

## 2013-12-30 DIAGNOSIS — R519 Headache, unspecified: Secondary | ICD-10-CM | POA: Insufficient documentation

## 2013-12-30 DIAGNOSIS — R51 Headache: Secondary | ICD-10-CM

## 2013-12-30 DIAGNOSIS — E669 Obesity, unspecified: Secondary | ICD-10-CM

## 2013-12-30 DIAGNOSIS — F17200 Nicotine dependence, unspecified, uncomplicated: Secondary | ICD-10-CM

## 2013-12-30 LAB — COMPREHENSIVE METABOLIC PANEL
ALT: 12 U/L (ref 0–35)
AST: 13 U/L (ref 0–37)
Albumin: 3.7 g/dL (ref 3.5–5.2)
Alkaline Phosphatase: 77 U/L (ref 39–117)
BUN: 9 mg/dL (ref 6–23)
CALCIUM: 8.9 mg/dL (ref 8.4–10.5)
CHLORIDE: 105 meq/L (ref 96–112)
CO2: 27 mEq/L (ref 19–32)
Creatinine, Ser: 0.7 mg/dL (ref 0.4–1.2)
GFR: 112.95 mL/min (ref >60.00)
Glucose, Bld: 77 mg/dL (ref 70–99)
Potassium: 3.9 mEq/L (ref 3.5–5.1)
Sodium: 139 mEq/L (ref 135–145)
Total Bilirubin: 0.1 mg/dL — ABNORMAL LOW (ref 0.3–1.2)
Total Protein: 7.3 g/dL (ref 6.0–8.3)

## 2013-12-30 LAB — T4, FREE: Free T4: 0.76 ng/dL (ref 0.60–1.60)

## 2013-12-30 LAB — CBC
HCT: 40.5 % (ref 36.0–46.0)
Hemoglobin: 13.1 g/dL (ref 12.0–15.0)
MCHC: 32.5 g/dL (ref 30.0–36.0)
MCV: 81.7 fl (ref 78.0–100.0)
PLATELETS: 220 10*3/uL (ref 150.0–400.0)
RBC: 4.96 Mil/uL (ref 3.87–5.11)
RDW: 16.3 % — AB (ref 11.5–14.6)
WBC: 8.4 10*3/uL (ref 4.5–10.5)

## 2013-12-30 LAB — TSH: TSH: 1.29 u[IU]/mL (ref 0.35–5.50)

## 2013-12-30 LAB — POCT PREGNANCY, URINE: PREG TEST UR: NEGATIVE

## 2013-12-30 NOTE — Assessment & Plan Note (Signed)
Educated regarding health risks associated with obesity.  Pt states she eats a lot of processed foods, 1 soda daily, juice daily, white flour. Not exercising. Recmd Cut out sugar. 30 minute walk daily. Discussed ref to [facility] and/or [facility] for medical weight loss. Pt will consider.

## 2013-12-30 NOTE — Assessment & Plan Note (Signed)
Educated on increased risk for stroke CVd, cancer. Tried to stop smoking in past through employer program-phone follow ups. Not successful. Recmd stop smoking. Discussed different methods including nicotene replacment, medications.

## 2013-12-30 NOTE — Progress Notes (Signed)
Pre visit review using our clinic review tool, if applicable. No additional management support is needed unless otherwise documented below in the visit note. 

## 2013-12-30 NOTE — Assessment & Plan Note (Addendum)
Waking w/frontal HA for last 10 days. Snores. DD: retained CO2 secondary to sleep apnea, hormonal, allergenic. POC urine preg-NEG. OTC meds. Ref for sleep study.

## 2013-12-30 NOTE — Patient Instructions (Signed)
I think morning headaches are r/t retained carbon dioxide from sleep apnea. Sleep study will help determine. Other causes could be allergies, hormonal. Blood pressure is good. It ok to take tylenol or iburpophen for headache, but consider alternating to avoid rebound headache. Take a walk (30 minutes) every day to lower your risks for diabetes, heart disease, diabetes, cancer, dementia; improve sleep quality, and help with weight loss. Cut out sugar and processed flour to decrease risk for diabetes, high cholesterol, and help with weight loss. Consider smoking cessation. Use nicotene replacement-gum or patches to decrease the number of cigs you smoke.  Headaches, Frequently Asked Questions MIGRAINE HEADACHES Q: What is migraine? What causes it? How can I treat it? A: Generally, migraine headaches begin as a dull ache. Then they develop into a constant, throbbing, and pulsating pain. You may experience pain at the temples. You may experience pain at the front or back of one or both sides of the head. The pain is usually accompanied by a combination of:  Nausea.  Vomiting.  Sensitivity to light and noise. Some people (about 15%) experience an aura (see below) before an attack. The cause of migraine is believed to be chemical reactions in the brain. Treatment for migraine may include over-the-counter or prescription medications. It may also include self-help techniques. These include relaxation training and biofeedback.  Q: What is an aura? A: About 15% of people with migraine get an "aura". This is a sign of neurological symptoms that occur before a migraine headache. You may see wavy or jagged lines, dots, or flashing lights. You might experience tunnel vision or blind spots in one or both eyes. The aura can include visual or auditory hallucinations (something imagined). It may include disruptions in smell (such as strange odors), taste or touch. Other symptoms include:  Numbness.  A "pins and  needles" sensation.  Difficulty in recalling or speaking the correct word. These neurological events may last as long as 60 minutes. These symptoms will fade as the headache begins. Q: What is a trigger? A: Certain physical or environmental factors can lead to or "trigger" a migraine. These include:  Foods.  Hormonal changes.  Weather.  Stress. It is important to remember that triggers are different for everyone. To help prevent migraine attacks, you need to figure out which triggers affect you. Keep a headache diary. This is a good way to track triggers. The diary will help you talk to your healthcare professional about your condition. Q: Does weather affect migraines? A: Bright sunshine, hot, humid conditions, and drastic changes in barometric pressure may lead to, or "trigger," a migraine attack in some people. But studies have shown that weather does not act as a trigger for everyone with migraines. Q: What is the link between migraine and hormones? A: Hormones start and regulate many of your body's functions. Hormones keep your body in balance within a constantly changing environment. The levels of hormones in your body are unbalanced at times. Examples are during menstruation, pregnancy, or menopause. That can lead to a migraine attack. In fact, about three quarters of all women with migraine report that their attacks are related to the menstrual cycle.  Q: Is there an increased risk of stroke for migraine sufferers? A: The likelihood of a migraine attack causing a stroke is very remote. That is not to say that migraine sufferers cannot have a stroke associated with their migraines. In persons under age 29, the most common associated factor for stroke is migraine headache. But  over the course of a person's normal life span, the occurrence of migraine headache may actually be associated with a reduced risk of dying from cerebrovascular disease due to stroke.  Q: What are acute medications  for migraine? A: Acute medications are used to treat the pain of the headache after it has started. Examples over-the-counter medications, NSAIDs, ergots, and triptans.  Q: What are the triptans? A: Triptans are the newest class of abortive medications. They are specifically targeted to treat migraine. Triptans are vasoconstrictors. They moderate some chemical reactions in the brain. The triptans work on receptors in your brain. Triptans help to restore the balance of a neurotransmitter called serotonin. Fluctuations in levels of serotonin are thought to be a main cause of migraine.  Q: Are over-the-counter medications for migraine effective? A: Over-the-counter, or "OTC," medications may be effective in relieving mild to moderate pain and associated symptoms of migraine. But you should see your caregiver before beginning any treatment regimen for migraine.  Q: What are preventive medications for migraine? A: Preventive medications for migraine are sometimes referred to as "prophylactic" treatments. They are used to reduce the frequency, severity, and length of migraine attacks. Examples of preventive medications include antiepileptic medications, antidepressants, beta-blockers, calcium channel blockers, and NSAIDs (nonsteroidal anti-inflammatory drugs). Q: Why are anticonvulsants used to treat migraine? A: During the past few years, there has been an increased interest in antiepileptic drugs for the prevention of migraine. They are sometimes referred to as "anticonvulsants". Both epilepsy and migraine may be caused by similar reactions in the brain.  Q: Why are antidepressants used to treat migraine? A: Antidepressants are typically used to treat people with depression. They may reduce migraine frequency by regulating chemical levels, such as serotonin, in the brain.  Q: What alternative therapies are used to treat migraine? A: The term "alternative therapies" is often used to describe treatments  considered outside the scope of conventional Western medicine. Examples of alternative therapy include acupuncture, acupressure, and yoga. Another common alternative treatment is herbal therapy. Some herbs are believed to relieve headache pain. Always discuss alternative therapies with your caregiver before proceeding. Some herbal products contain arsenic and other toxins. TENSION HEADACHES Q: What is a tension-type headache? What causes it? How can I treat it? A: Tension-type headaches occur randomly. They are often the result of temporary stress, anxiety, fatigue, or anger. Symptoms include soreness in your temples, a tightening band-like sensation around your head (a "vice-like" ache). Symptoms can also include a pulling feeling, pressure sensations, and contracting head and neck muscles. The headache begins in your forehead, temples, or the back of your head and neck. Treatment for tension-type headache may include over-the-counter or prescription medications. Treatment may also include self-help techniques such as relaxation training and biofeedback. CLUSTER HEADACHES Q: What is a cluster headache? What causes it? How can I treat it? A: Cluster headache gets its name because the attacks come in groups. The pain arrives with little, if any, warning. It is usually on one side of the head. A tearing or bloodshot eye and a runny nose on the same side of the headache may also accompany the pain. Cluster headaches are believed to be caused by chemical reactions in the brain. They have been described as the most severe and intense of any headache type. Treatment for cluster headache includes prescription medication and oxygen. SINUS HEADACHES Q: What is a sinus headache? What causes it? How can I treat it? A: When a cavity in the bones of the face  and skull (a sinus) becomes inflamed, the inflammation will cause localized pain. This condition is usually the result of an allergic reaction, a tumor, or an  infection. If your headache is caused by a sinus blockage, such as an infection, you will probably have a fever. An x-ray will confirm a sinus blockage. Your caregiver's treatment might include antibiotics for the infection, as well as antihistamines or decongestants.  REBOUND HEADACHES Q: What is a rebound headache? What causes it? How can I treat it? A: A pattern of taking acute headache medications too often can lead to a condition known as "rebound headache." A pattern of taking too much headache medication includes taking it more than 2 days per week or in excessive amounts. That means more than the label or a caregiver advises. With rebound headaches, your medications not only stop relieving pain, they actually begin to cause headaches. Doctors treat rebound headache by tapering the medication that is being overused. Sometimes your caregiver will gradually substitute a different type of treatment or medication. Stopping may be a challenge. Regularly overusing a medication increases the potential for serious side effects. Consult a caregiver if you regularly use headache medications more than 2 days per week or more than the label advises. ADDITIONAL QUESTIONS AND ANSWERS Q: What is biofeedback? A: Biofeedback is a self-help treatment. Biofeedback uses special equipment to monitor your body's involuntary physical responses. Biofeedback monitors:  Breathing.  Pulse.  Heart rate.  Temperature.  Muscle tension.  Brain activity. Biofeedback helps you refine and perfect your relaxation exercises. You learn to control the physical responses that are related to stress. Once the technique has been mastered, you do not need the equipment any more. Q: Are headaches hereditary? A: Four out of five (80%) of people that suffer report a family history of migraine. Scientists are not sure if this is genetic or a family predisposition. Despite the uncertainty, a child has a 50% chance of having migraine if  one parent suffers. The child has a 75% chance if both parents suffer.  Q: Can children get headaches? A: By the time they reach high school, most [place] people have experienced some type of headache. Many safe and effective approaches or medications can prevent a headache from occurring or stop it after it has begun.  Q: What type of doctor should I see to diagnose and treat my headache? A: Start with your primary caregiver. Discuss his or her experience and approach to headaches. Discuss methods of classification, diagnosis, and treatment. Your caregiver may decide to recommend you to a headache specialist, depending upon your symptoms or other physical conditions. Having diabetes, allergies, etc., may require a more comprehensive and inclusive approach to your headache. The National Headache Foundation will provide, upon request, a list of [facility] physician members in your state. Document Released: 12/14/2003 Document Revised: 12/16/2011 Document Reviewed: 05/23/2008 [facility] Patient Information 2014 [place], [place]. Exercise to Lose Weight Exercise and a healthy diet may help you lose weight. Your doctor may suggest specific exercises. EXERCISE IDEAS AND TIPS  Choose low-cost things you enjoy doing, such as walking, bicycling, or exercising to workout videos.  Take stairs instead of the elevator.  Walk during your lunch break.  Park your car further away from work or school.  Go to a gym or an exercise class.  Start with 5 to 10 minutes of exercise each day. Build up to 30 minutes of exercise 4 to 6 days a week.  Wear shoes with good support and comfortable  clothes.  Stretch before and after working out.  Work out until you breathe harder and your heart beats faster.  Drink extra water when you exercise.  Do not do so much that you hurt yourself, feel dizzy, or get very short of breath. Exercises that burn about 150 calories:  Running 1  miles in 15 minutes.  Playing volleyball  for 45 to 60 minutes.  Washing and waxing a car for 45 to 60 minutes.  Playing touch football for 45 minutes.  Walking 1  miles in 35 minutes.  Pushing a stroller 1  miles in 30 minutes.  Playing basketball for 30 minutes.  Raking leaves for 30 minutes.  Bicycling 5 miles in 30 minutes.  Walking 2 miles in 30 minutes.  Dancing for 30 minutes.  Shoveling snow for 15 minutes.  Swimming laps for 20 minutes.  Walking up stairs for 15 minutes.  Bicycling 4 miles in 15 minutes.  Gardening for 30 to 45 minutes.  Jumping rope for 15 minutes.  Washing windows or floors for 45 to 60 minutes. Document Released: 10/26/2010 Document Revised: 12/16/2011 Document Reviewed: 10/26/2010 [facility] Patient Information 2014 [place], [place].   Classification: Obese class III.  BMI Category: 40.0 or more  Risk of developing health problems: Extremely high. Note: For persons 20 years and older the 'normal' range may begin slightly above BMI 18.5 and extend into the 'overweight' range.   To clarify risk for each individual, other factors also need to be considered, such as:  Lifestyle habits.  Fitness level.  Presence or absence of other health risk conditions.  The classification system may underestimate or overestimate health risks in certain adults, such as:  Highly muscular adults. Very muscular adults, such as athletes, may have a low percentage of body fat but a large amount of muscle tissue. This can result in a BMI in the overweight range that may over estimate the risk of developing health problems.  Adults who naturally have a very lean body build.  [place] adults who have not reached full growth.  Adults over 82 years of age. For adults over age 54, more research is needed to determine if the cut-off points for the 'normal weight' range differ in any way from those for younger adults.  It is also important to note that BMI is only one part of a health risk assessment.  To further clarify risk, other factors need to be considered as well.  Age, inherited traits, presence or absence of other conditions such as diabetes, high blood lipids, hypertension, and high blood glucose levels also influence the development of diseases associated with overweight. Risk factors such as poor eating habits, physical inactivity, and tobacco use can play a role in the development of diseases associated with both overweight and underweight. Consult a caregiver for a more complete assessment of your weight as it relates to health risk. It is important to discuss with your caregiver what BMI means for you as an individual. Maintaining a 'normal weight' is one element of good health. However, unhealthy eating habits, low levels of physical activity and tobacco use will increase the risk of health problems even for those within the normal weight range.  Being overweight indicates some risk to health. But research suggests that regular physical activity can decrease the risk of several health problems. Equally, a nutritious diet has been shown to decrease some of the risks associated with overweight. It is important to emphasize that a weight classification system is but one tool  to assess health risks in individuals.  Document Released: 06/04/2004 Document Revised: 12/16/2011 Document Reviewed: 07/03/2005 [facility] Patient Information 2014 [place], [place].  You Can Quit Smoking If you are ready to quit smoking or are thinking about it, congratulations! You have chosen to help yourself be healthier and live longer! There are lots of different ways to quit smoking. Nicotine gum, nicotine patches, a nicotine inhaler, or nicotine nasal spray can help with physical craving. Hypnosis, support groups, and medicines help break the habit of smoking. TIPS TO GET OFF AND STAY OFF CIGARETTES  Learn to predict your moods. Do not let a bad situation be your excuse to have a cigarette. Some situations in  your life might tempt you to have a cigarette.  Ask friends and co-workers not to smoke around you.  Make your home smoke-free.  Never have "just one" cigarette. It leads to wanting another and another. Remind yourself of your decision to quit.  On a card, make a list of your reasons for not smoking. Read it at least the same number of times a day as you have a cigarette. Tell yourself everyday, "I do not want to smoke. I choose not to smoke."  Ask someone at home or work to help you with your plan to quit smoking.  Have something planned after you eat or have a cup of coffee. Take a walk or get other exercise to perk you up. This will help to keep you from overeating.  Try a relaxation exercise to calm you down and decrease your stress. Remember, you may be tense and nervous the first two weeks after you quit. This will pass.  Find new activities to keep your hands busy. Play with a pen, coin, or rubber band. Doodle or draw things on paper.  Brush your teeth right after eating. This will help cut down the craving for the taste of tobacco after meals. You can try mouthwash too.  Try gum, breath mints, or diet candy to keep something in your mouth. IF YOU SMOKE AND WANT TO QUIT:  Do not stock up on cigarettes. Never buy a carton. Wait until one pack is finished before you buy another.  Never carry cigarettes with you at work or at home.  Keep cigarettes as far away from you as possible. Leave them with someone else.  Never carry matches or a lighter with you.  Ask yourself, "Do I need this cigarette or is this just a reflex?"  Bet with someone that you can quit. Put cigarette money in a piggy bank every morning. If you smoke, you give up the money. If you do not smoke, by the end of the week, you keep the money.  Keep trying. It takes 21 days to change a habit!  Talk to your doctor about using medicines to help you quit. These include nicotine replacement gum, lozenges, or skin  patches. Document Released: 07/20/2009 Document Revised: 12/16/2011 Document Reviewed: 07/20/2009 [facility] Patient Information 2014 [place], [place].

## 2014-01-01 NOTE — Progress Notes (Signed)
Subjective:    [name] is a 43 y.o. female who presents for evaluation of headache. Symptoms are recurrent, but have been consistent for last 10 days ago. Generally, headaches are present on waking, are relieved with 2T aleve, but return after few hours. The headaches are usually dull and are frontal .Work attendance or other daily activities are not affected by the headaches.The headaches are usually not preceded by an aura. Associated neurologic symptoms: none. The patient denies dizziness, muscle weakness, numbness of extremities, speech difficulties, vision problems and vomiting in the early morning.Other history includes: obesity, smoker, snores, works at [employer]-can't remember last eye exam, irregular menstrual cycles-last cycle was 2 mos ago. Family history includes no known family members with significant headaches.  The following portions of the patient's history were reviewed and updated as appropriate: allergies, current medications, past medical history, past social history, past surgical history and problem list.  Review of Systems Pertinent items are noted in HPI.    Objective:    BP 115/70  Pulse 83  Temp(Src) 98.2 F (36.8 C) (Oral)  Ht 5\' 4"  (1.626 m)  Wt 392 lb (177.81 kg)  BMI 67.25 kg/m2  SpO2 100%  LMP 11/07/2013 General appearance: alert, cooperative and appears stated age Head: Normocephalic, without obvious abnormality, atraumatic Eyes: negative findings: lids and lashes normal and conjunctivae and sclerae normal Ears: normal TM's and external ear canals both ears Throat: lips, mucosa, and tongue normal; teeth and gums normal Lungs: clear to auscultation bilaterally Heart: regular rate and rhythm, S1, S2 normal, no murmur, click, rub or gallop Lymph nodes: No cervical, tonsillar,  or [place] LAD Neurologic: Grossly normal    Assessment & Plan:      1. Morning headache DD: retained CO2-sleep apnea, allergenic, chiari malformation, hormonal - Ambulatory  referral to Pulmonology for sleep study - POCT Pregnancy, Urine-NEg - CBC - Comprehensive metabolic panel Continue OTC meds.  2. Obesity Morbid obesity Discussed associated health risks. Encourage cut out sugar & white flour, increase activity. [facility] Pt will [facility].  - TSH - T4, free  3. Smoker unmotivated to quit Discussed health risks Offered meds, nicotene patches, pt will consider.  [facility] mo.

## 2014-01-10 ENCOUNTER — Telehealth: Payer: Self-pay | Admitting: Family Medicine

## 2014-01-10 NOTE — Telephone Encounter (Signed)
Patient called and stated that she was seen by [name] for headaches about two weeks ago. Patient states that the tylenol and Ibuprofen is not helping. Is there anything else she can take that will help with her headaches. Please advise

## 2014-01-10 NOTE — Telephone Encounter (Signed)
Please advise 

## 2014-01-11 NOTE — Telephone Encounter (Signed)
pls call pt: May be allergy related. Try Aleve-D 1T qd. She will get this in the cold & allergy isle of pharmacy. She will look for a card & take card to pharmacist. Pharmacist will pull from behind counter. Please remind her to keep pulmonology appointment next week for sleep study.

## 2014-01-11 NOTE — Telephone Encounter (Signed)
Left message with pts mother

## 2014-01-19 ENCOUNTER — Institutional Professional Consult (permissible substitution): Payer: 59 | Admitting: Pulmonary Disease

## 2014-01-31 ENCOUNTER — Ambulatory Visit: Payer: 59 | Admitting: Family Medicine

## 2014-02-11 ENCOUNTER — Institutional Professional Consult (permissible substitution): Payer: 59 | Admitting: Pulmonary Disease

## 2014-02-18 ENCOUNTER — Ambulatory Visit: Payer: 59 | Admitting: Family Medicine

## 2019-09-14 ENCOUNTER — Other Ambulatory Visit: Payer: Self-pay

## 2019-09-14 ENCOUNTER — Emergency Department (HOSPITAL_COMMUNITY): Payer: Self-pay

## 2019-09-14 ENCOUNTER — Encounter (HOSPITAL_COMMUNITY): Payer: Self-pay

## 2019-09-14 DIAGNOSIS — R079 Chest pain, unspecified: Secondary | ICD-10-CM | POA: Insufficient documentation

## 2019-09-14 DIAGNOSIS — Z5321 Procedure and treatment not carried out due to patient leaving prior to being seen by health care provider: Secondary | ICD-10-CM | POA: Insufficient documentation

## 2019-09-14 LAB — I-STAT BETA HCG BLOOD, ED (NOT ORDERABLE): I-stat hCG, quantitative: <5 m[IU]/mL (ref <5)

## 2019-09-14 LAB — BASIC METABOLIC PANEL
Anion gap: 9 (ref 5–15)
BUN: 10 mg/dL (ref 6–20)
CO2: 25 mmol/L (ref 22–32)
Calcium: 8.8 mg/dL — ABNORMAL LOW (ref 8.9–10.3)
Chloride: 104 mmol/L (ref 98–111)
Creatinine, Ser: 0.72 mg/dL (ref 0.44–1.00)
GFR calc Af Amer: >60 mL/min (ref >60)
GFR calc non Af Amer: >60 mL/min (ref >60)
Glucose, Bld: 95 mg/dL (ref 70–99)
Potassium: 3.8 mmol/L (ref 3.5–5.1)
Sodium: 138 mmol/L (ref 135–145)

## 2019-09-14 LAB — CBC
HCT: 44.6 % (ref 36.0–46.0)
Hemoglobin: 14.1 g/dL (ref 12.0–15.0)
MCH: 27 pg (ref 26.0–34.0)
MCHC: 31.6 g/dL (ref 30.0–36.0)
MCV: 85.4 fL (ref 80.0–100.0)
Platelets: 232 10*3/uL (ref 150–400)
RBC: 5.22 MIL/uL — ABNORMAL HIGH (ref 3.87–5.11)
RDW: 15 % (ref 11.5–15.5)
WBC: 10.2 10*3/uL (ref 4.0–10.5)
nRBC: 0 % (ref 0.0–0.2)

## 2019-09-14 LAB — TROPONIN I (HIGH SENSITIVITY): Troponin I (High Sensitivity): <2 ng/L (ref <18)

## 2019-09-14 MED ORDER — SODIUM CHLORIDE 0.9% FLUSH: 3.0000 mL | Freq: Once | INTRAVENOUS | Status: DC

## 2019-09-14 NOTE — ED Triage Notes (Signed)
Pt presents with c/o chest pain that began at 1530. Pt describes pain as pressures in the center of her chest that moves to the back. Pt states that pain eased off while sitting in the waiting room, however, once she gets up and moves the pain returns. Pt denies N/V, SHOB, abd pain, chills, or sweats. Pt denies contact with COVID.

## 2019-09-15 ENCOUNTER — Emergency Department (HOSPITAL_COMMUNITY)
Admission: EM | Admit: 2019-09-15 | Discharge: 2019-09-15 | Disposition: A | Discharge status: Home / Self Care | Payer: Self-pay | Attending: Emergency Medicine | Admitting: Emergency Medicine

## 2019-09-15 NOTE — ED Notes (Signed)
Pt called x1 for vitals to be rechecked

## 2020-03-30 ENCOUNTER — Other Ambulatory Visit: Payer: Self-pay

## 2020-03-31 ENCOUNTER — Ambulatory Visit (INDEPENDENT_AMBULATORY_CARE_PROVIDER_SITE_OTHER): Payer: Managed Care, Other (non HMO) | Admitting: Family Medicine

## 2020-03-31 ENCOUNTER — Encounter: Payer: Self-pay | Admitting: Family Medicine

## 2020-03-31 DIAGNOSIS — Z13220 Encounter for screening for lipoid disorders: Secondary | ICD-10-CM | POA: Diagnosis not present

## 2020-03-31 DIAGNOSIS — N926 Irregular menstruation, unspecified: Secondary | ICD-10-CM

## 2020-03-31 DIAGNOSIS — Z720 Tobacco use: Secondary | ICD-10-CM | POA: Diagnosis not present

## 2020-03-31 DIAGNOSIS — Z131 Encounter for screening for diabetes mellitus: Secondary | ICD-10-CM | POA: Diagnosis not present

## 2020-03-31 LAB — LIPID PANEL
Cholesterol: 173 mg/dL (ref 0–200)
HDL: 40.1 mg/dL (ref >39.00)
LDL Cholesterol: 119 mg/dL — ABNORMAL HIGH (ref 0–99)
NonHDL: 133.09
Total CHOL/HDL Ratio: 4
Triglycerides: 70 mg/dL (ref 0.0–149.0)
VLDL: 14 mg/dL (ref 0.0–40.0)

## 2020-03-31 LAB — CBC WITH DIFFERENTIAL/PLATELET
Basophils Absolute: 0.1 10*3/uL (ref 0.0–0.1)
Basophils Relative: 0.8 % (ref 0.0–3.0)
Eosinophils Absolute: 0.1 10*3/uL (ref 0.0–0.7)
Eosinophils Relative: 1.4 % (ref 0.0–5.0)
HCT: 42 % (ref 36.0–46.0)
Hemoglobin: 13.9 g/dL (ref 12.0–15.0)
Lymphocytes Relative: 26 % (ref 12.0–46.0)
Lymphs Abs: 2.3 10*3/uL (ref 0.7–4.0)
MCHC: 33 g/dL (ref 30.0–36.0)
MCV: 81 fl (ref 78.0–100.0)
Monocytes Absolute: 0.6 10*3/uL (ref 0.1–1.0)
Monocytes Relative: 6.5 % (ref 3.0–12.0)
Neutro Abs: 5.9 10*3/uL (ref 1.4–7.7)
Neutrophils Relative %: 65.3 % (ref 43.0–77.0)
Platelets: 259 10*3/uL (ref 150.0–400.0)
RBC: 5.19 Mil/uL — ABNORMAL HIGH (ref 3.87–5.11)
RDW: 16.5 % — ABNORMAL HIGH (ref 11.5–15.5)
WBC: 9 10*3/uL (ref 4.0–10.5)

## 2020-03-31 LAB — COMPREHENSIVE METABOLIC PANEL
ALT: 8 U/L (ref 0–35)
AST: 10 U/L (ref 0–37)
Albumin: 3.9 g/dL (ref 3.5–5.2)
Alkaline Phosphatase: 77 U/L (ref 39–117)
BUN: 7 mg/dL (ref 6–23)
CO2: 27 mEq/L (ref 19–32)
Calcium: 9 mg/dL (ref 8.4–10.5)
Chloride: 105 mEq/L (ref 96–112)
Creatinine, Ser: 0.7 mg/dL (ref 0.40–1.20)
GFR: 108.38 mL/min (ref >60.00)
Glucose, Bld: 85 mg/dL (ref 70–99)
Potassium: 4.1 mEq/L (ref 3.5–5.1)
Sodium: 140 mEq/L (ref 135–145)
Total Bilirubin: 0.4 mg/dL (ref 0.2–1.2)
Total Protein: 6.7 g/dL (ref 6.0–8.3)

## 2020-03-31 LAB — TSH: TSH: 1.95 u[IU]/mL (ref 0.35–4.50)

## 2020-03-31 NOTE — Progress Notes (Signed)
[name] DOB: 1971/07/22 Encounter date: 03/31/2020  This is a 49 y.o. female who presents to establish care. No chief complaint on file.   History of present illness: No specific concerns today.  No problems with falling or staying asleep. Rested if she makes sure she gets enough sleep.   Smoking has increased now that she is working at home.   Periods now are monthly since she has lost weight. Not consistent with when they come. Lasting for a few days. Second day is heaviest and has to go through 3 pads on those days. No clots.   Doesn't keep track of her weight, but knows she has gone down in size. 26/28 now from 34/36. Walking and has cut back on pepsi and sweet intake.    Past Medical History:  Diagnosis Date  . Anemia   . Blood in stool   . Chicken pox   . Endometrial polyp 2003 and 2005  . Menorrhagia   . Morbid obesity ([place])   . PCOS (polycystic ovarian syndrome)   . Polyp cervix removed twice   Past Surgical History:  Procedure Laterality Date  . Uterine polyp excision     [facility]   No Known Allergies Current Meds  Medication Sig  . ibuprofen (ADVIL) 200 MG tablet Take 200 mg by mouth every 6 (six) hours as needed for headache.   Social History   Tobacco Use  . Smoking status: Current Every Day Smoker    Packs/day: 0.50    Types: Cigarettes  . Smokeless tobacco: Never Used  Substance Use Topics  . Alcohol use: Yes    Comment: occasional   Family History  Problem Relation Age of Onset  . Hypertension Mother   . Hypertension Maternal Grandmother   . Alzheimer's disease Maternal Grandmother   . Alzheimer's disease Father 95     Review of Systems  Constitutional: Negative for chills, fatigue and fever.  Respiratory: Negative for cough, chest tightness, shortness of breath and wheezing.   Cardiovascular: Negative for chest pain, palpitations and leg swelling.    Objective:  BP 112/80 (BP Location: Left Arm, Patient Position:  Sitting, Cuff Size: Large)   Pulse 76   Temp 97.9 F (36.6 C) (Temporal)   Ht 5\' 3"  (1.6 m)   Wt (!) 353 lb 6.4 oz (160.3 kg)   LMP 03/16/2020 (Exact Date)   BMI 62.60 kg/m   Weight: (!) 353 lb 6.4 oz (160.3 kg)   BP Readings from Last 3 Encounters:  03/31/20 112/80  09/14/19 119/82  12/30/13 115/70   Wt Readings from Last 3 Encounters:  03/31/20 (!) 353 lb 6.4 oz (160.3 kg)  12/30/13 (!) 392 lb (177.8 kg)  01/01/13 (!) 350 lb (158.8 kg)    Physical Exam Constitutional:      General: She is not in acute distress.    Appearance: She is well-developed.  Cardiovascular:     Rate and Rhythm: Normal rate and regular rhythm.     Heart sounds: Normal heart sounds. No murmur heard.  No friction rub.  Pulmonary:     Effort: Pulmonary effort is normal. No respiratory distress.     Breath sounds: Normal breath sounds. No wheezing or rales.  Musculoskeletal:     Cervical back: No tenderness.     Right lower leg: No edema.     Left lower leg: No edema.  Neurological:     Mental Status: She is alert and oriented to person, place, and time.  Psychiatric:  Behavior: Behavior normal.     Assessment/Plan:  1. Morbid obesity (HCC) She is working on [employer] and daily walking.   2. Tobacco abuse Not interested in quitting smoking, but we discussed in detail today. Discussed negative health consequences of smoking and benefits of smoking cessation with patient in detail. Discussed medical treatment options including patches, gums, zyban, and chantix. Encouraged to let me know if any concerns or if needing help with quitting. 1-800-QUIT-NOW also available to help through quitting process.  Willing to quit? no Plan for quitting: no Follow up will discuss at upcoming physical    3. Lipid screening - Lipid panel; Future  4. Screening for diabetes mellitus - Comprehensive metabolic panel; Future  5. Irregular periods - CBC with Differential/Platelet; Future - TSH;  Future  Return for physical exam.  Theodis Shove, MD

## 2020-03-31 NOTE — Patient Instructions (Signed)
1-800-QUIT-NOW  Discussed negative health consequences of smoking and benefits of smoking cessation with patient in detail. Discussed medical treatment options including patches, gums, zyban, and chantix.

## 2020-03-31 NOTE — Addendum Note (Signed)
Addended by: Lerry Liner on: 03/31/2020 01:46 PM   Modules accepted: Orders

## 2020-08-29 ENCOUNTER — Encounter (HOSPITAL_COMMUNITY): Payer: Self-pay

## 2020-08-29 ENCOUNTER — Other Ambulatory Visit: Payer: Self-pay

## 2020-08-29 ENCOUNTER — Emergency Department (HOSPITAL_COMMUNITY): Payer: Managed Care, Other (non HMO)

## 2020-08-29 ENCOUNTER — Emergency Department (HOSPITAL_COMMUNITY)
Admission: EM | Admit: 2020-08-29 | Discharge: 2020-08-29 | Disposition: A | Discharge status: Home / Self Care | Payer: Managed Care, Other (non HMO) | Attending: Emergency Medicine | Admitting: Emergency Medicine

## 2020-08-29 DIAGNOSIS — F17210 Nicotine dependence, cigarettes, uncomplicated: Secondary | ICD-10-CM | POA: Insufficient documentation

## 2020-08-29 DIAGNOSIS — R0789 Other chest pain: Secondary | ICD-10-CM | POA: Insufficient documentation

## 2020-08-29 LAB — BASIC METABOLIC PANEL
Anion gap: 7 (ref 5–15)
BUN: 10 mg/dL (ref 6–20)
CO2: 27 mmol/L (ref 22–32)
Calcium: 8.5 mg/dL — ABNORMAL LOW (ref 8.9–10.3)
Chloride: 104 mmol/L (ref 98–111)
Creatinine, Ser: 0.76 mg/dL (ref 0.44–1.00)
GFR, Estimated: >60 mL/min (ref >60)
Glucose, Bld: 92 mg/dL (ref 70–99)
Potassium: 4 mmol/L (ref 3.5–5.1)
Sodium: 138 mmol/L (ref 135–145)

## 2020-08-29 LAB — HEPATIC FUNCTION PANEL
ALT: 10 U/L (ref 0–44)
AST: 13 U/L — ABNORMAL LOW (ref 15–41)
Albumin: 3.5 g/dL (ref 3.5–5.0)
Alkaline Phosphatase: 68 U/L (ref 38–126)
Bilirubin, Direct: <0.1 mg/dL (ref 0.0–0.2)
Total Bilirubin: 0.2 mg/dL — ABNORMAL LOW (ref 0.3–1.2)
Total Protein: 7.1 g/dL (ref 6.5–8.1)

## 2020-08-29 LAB — CBC
HCT: 42.1 % (ref 36.0–46.0)
Hemoglobin: 13.5 g/dL (ref 12.0–15.0)
MCH: 27.3 pg (ref 26.0–34.0)
MCHC: 32.1 g/dL (ref 30.0–36.0)
MCV: 85.1 fL (ref 80.0–100.0)
Platelets: 233 10*3/uL (ref 150–400)
RBC: 4.95 MIL/uL (ref 3.87–5.11)
RDW: 15.6 % — ABNORMAL HIGH (ref 11.5–15.5)
WBC: 9.3 10*3/uL (ref 4.0–10.5)
nRBC: 0 % (ref 0.0–0.2)

## 2020-08-29 LAB — TROPONIN I (HIGH SENSITIVITY): Troponin I (High Sensitivity): <2 ng/L (ref <18)

## 2020-08-29 LAB — I-STAT BETA HCG BLOOD, ED (MC, WL, AP ONLY): I-stat hCG, quantitative: <5 m[IU]/mL (ref <5)

## 2020-08-29 LAB — LIPASE, BLOOD: Lipase: 23 U/L (ref 11–51)

## 2020-08-29 MED ORDER — ALUM & MAG HYDROXIDE-SIMETH 200-200-20 MG/5ML PO SUSP
30.0000 mL | Freq: Once | ORAL | Status: AC
  Administered 2020-08-29: 30 mL via ORAL
  Filled 2020-08-29: qty 30

## 2020-08-29 MED ORDER — LIDOCAINE VISCOUS HCL 2 % MT SOLN
15.0000 mL | Freq: Once | OROMUCOSAL | Status: AC
  Administered 2020-08-29: 15 mL via ORAL
  Filled 2020-08-29: qty 15

## 2020-08-29 NOTE — ED Triage Notes (Signed)
Pt presents with c/o chest pain for approx one week. Pt reports the pain became worse today. Pt reports the pain is in the center of her chest.

## 2020-08-29 NOTE — ED Provider Notes (Signed)
[facility]-EMERGENCY DEPT Provider Note   CSN: [id number] Arrival date & time: 08/29/20  1051     History Chief Complaint  Patient presents with  . Chest Pain    [name] is a 48 y.o. female.  The history is provided by the patient.  Chest Pain Pain location:  Substernal area Pain quality: aching   Pain radiates to:  Does not radiate Pain severity:  Mild Onset quality:  Gradual Timing:  Intermittent Progression:  Waxing and waning Chronicity:  New Context: eating and at rest   Relieved by:  Nothing Worsened by:  Nothing Associated symptoms: no abdominal pain, no back pain, no cough, no fever, no nausea, no palpitations, no shortness of breath and no vomiting   Risk factors: smoking   Risk factors: no coronary artery disease, no diabetes mellitus, no high cholesterol, no hypertension and no prior DVT/PE        Past Medical History:  Diagnosis Date  . Anemia   . Blood in stool   . Chicken pox   . Endometrial polyp 2003 and 2005  . Menorrhagia   . Morbid obesity (HCC)   . PCOS (polycystic ovarian syndrome)   . Polyp cervix removed twice    Patient Active Problem List   Diagnosis Date Noted  . Smoker unmotivated to quit 12/30/2013  . Morning headache 12/30/2013  . PCOS (polycystic ovarian syndrome) 01/01/2013  . General medical examination 10/15/2011  . Obesity 10/15/2011  . Headache(784.0) 08/19/2011    Past Surgical History:  Procedure Laterality Date  . Uterine polyp excision     [facility]     OB History    Gravida  0   Para      Term      Preterm      AB      Living        SAB      TAB      Ectopic      Multiple      Live Births              Family History  Problem Relation Age of Onset  . Hypertension Mother   . Hypertension Maternal Grandmother   . Alzheimer's disease Maternal Grandmother   . Alzheimer's disease Father 22    Social History   Tobacco Use  . Smoking status:  Current Every Day Smoker    Packs/day: 0.50    Types: Cigarettes  . Smokeless tobacco: Never Used  Vaping Use  . Vaping Use: Never assessed  Substance Use Topics  . Alcohol use: Yes    Comment: occasional  . Drug use: No    Home Medications Prior to Admission medications   Not on File    Allergies    Patient has no known allergies.  Review of Systems   Review of Systems  Constitutional: Negative for chills and fever.  HENT: Negative for ear pain and sore throat.   Eyes: Negative for pain and visual disturbance.  Respiratory: Negative for cough and shortness of breath.   Cardiovascular: Positive for chest pain. Negative for palpitations.  Gastrointestinal: Negative for abdominal pain, nausea and vomiting.  Genitourinary: Negative for dysuria and hematuria.  Musculoskeletal: Negative for arthralgias and back pain.  Skin: Negative for color change and rash.  Neurological: Negative for seizures and syncope.  All other systems reviewed and are negative.   Physical Exam Updated Vital Signs  ED Triage Vitals  Enc Vitals Group  BP 08/29/20 1101 135/71     Pulse Rate 08/29/20 1101 71     Resp 08/29/20 1101 19     Temp 08/29/20 1101 98.2 F (36.8 C)     Temp Source 08/29/20 1101 Oral     SpO2 08/29/20 1101 99 %     Weight --      Height --      Head Circumference --      Peak Flow --      Pain Score 08/29/20 1059 7     Pain Loc --      Pain Edu? --      Excl. in GC? --     Physical Exam Vitals and nursing note reviewed.  Constitutional:      General: She is not in acute distress.    Appearance: She is well-developed. She is not ill-appearing.  HENT:     Head: Normocephalic and atraumatic.  Eyes:     Extraocular Movements: Extraocular movements intact.     Conjunctiva/sclera: Conjunctivae normal.     Pupils: Pupils are equal, round, and reactive to light.  Cardiovascular:     Rate and Rhythm: Normal rate and regular rhythm.     Pulses:          Radial  pulses are 2+ on the right side and 2+ on the left side.     Heart sounds: Normal heart sounds. No murmur heard.   Pulmonary:     Effort: Pulmonary effort is normal. No respiratory distress.     Breath sounds: Normal breath sounds. No decreased breath sounds, wheezing or rhonchi.  Abdominal:     Palpations: Abdomen is soft.     Tenderness: There is no abdominal tenderness.  Musculoskeletal:        General: Normal range of motion.     Cervical back: Normal range of motion and neck supple.     Right lower leg: No edema.     Left lower leg: No edema.  Skin:    General: Skin is warm and dry.     Capillary Refill: Capillary refill takes less than 2 seconds.  Neurological:     General: No focal deficit present.     Mental Status: She is alert.  Psychiatric:        Mood and Affect: Mood normal.     ED Results / Procedures / Treatments   Labs (all labs ordered are listed, but only abnormal results are displayed) Labs Reviewed  BASIC METABOLIC PANEL - Abnormal; Notable for the following components:      Result Value   Calcium 8.5 (*)    All other components within normal limits  CBC - Abnormal; Notable for the following components:   RDW 15.6 (*)    All other components within normal limits  HEPATIC FUNCTION PANEL - Abnormal; Notable for the following components:   AST 13 (*)    Total Bilirubin 0.2 (*)    All other components within normal limits  LIPASE, BLOOD  I-STAT BETA HCG BLOOD, ED ([facility], [facility], [facility] ONLY)  TROPONIN I (HIGH SENSITIVITY)    EKG EKG Interpretation  Date/Time:  Tuesday August 29 2020 10:59:58 EST Ventricular Rate:  66 PR Interval:    QRS Duration: 94 QT Interval:  387 QTC Calculation: 406 R Axis:   53 Text Interpretation: Sinus rhythm 12 Lead; [place] Confirmed by Virgina Norfolk [phone]) on 08/29/2020 11:06:50 AM Also confirmed by Virgina Norfolk [phone])  on 08/29/2020 12:08:09 PM   Radiology DG  Chest 2 View  Result Date: 08/29/2020 CLINICAL DATA:   Chest pain, shortness of breath EXAM: CHEST - 2 VIEW COMPARISON:  09/14/2019 FINDINGS: The heart size and mediastinal contours are stable. No focal airspace consolidation, pleural effusion, or pneumothorax. The visualized skeletal structures are unremarkable. IMPRESSION: No active cardiopulmonary disease. Electronically Signed   By: Duanne Guess D.O.   On: 08/29/2020 11:58    Procedures Procedures (including critical care time)  Medications Ordered in ED Medications  alum & mag hydroxide-simeth (MAALOX/MYLANTA) 200-200-20 MG/5ML suspension 30 mL (30 mLs Oral Given 08/29/20 1323)    And  lidocaine (XYLOCAINE) 2 % viscous mouth solution 15 mL (15 mLs Oral Given 08/29/20 1324)    ED Course  I have reviewed the triage vital signs and the nursing notes.  Pertinent labs & imaging results that were available during my care of the patient were reviewed by me and considered in my medical decision making (see chart for details).    MDM Rules/Calculators/A&P                          [name] is a 49 year old female with history of obesity presents to the ED with chest pain.  Chest pain for the past week.  Possibly associated with eating.  In the middle of her chest.  Pain fairly constant for the last 6 to 8 hours today.  Patient with no PE risk factors.  No shortness of breath.  No cough or congestion.  EKG shows sinus rhythm.  Troponin undetectable.  Does not have any cardiac risk factors.  Overall low heart score.  Given that pain has been more than 8 hours and patient with negative troponin suspect ACS is unlikely.  PERC negative and doubt PE.  No signs of pneumonia or pneumothorax on chest x-ray.  No significant anemia, electrolyte abnormality, kidney injury otherwise.  Overall patient given reassurance.  We will have her follow-up with cardiology and primary care to see if a stress test would be necessary however suspect that pain may improve on its own.  Discharged in ED in good  condition. Suspect GI related.  This chart was dictated using voice recognition software.  Despite best efforts to proofread,  errors can occur which can change the documentation meaning.    Final Clinical Impression(s) / ED Diagnoses Final diagnoses:  Atypical chest pain    Rx / DC Orders ED Discharge Orders    None       Virgina Norfolk, DO 08/29/20 1328

## 2020-08-29 NOTE — ED Notes (Signed)
Pt returned from xray

## 2020-10-09 ENCOUNTER — Encounter: Payer: Self-pay | Admitting: Family Medicine

## 2020-10-09 ENCOUNTER — Other Ambulatory Visit (HOSPITAL_COMMUNITY)
Admission: RE | Admit: 2020-10-09 | Discharge: 2020-10-09 | Disposition: A | Discharge status: Home / Self Care | Payer: Managed Care, Other (non HMO) | Source: Ambulatory Visit | Attending: Family Medicine | Admitting: Family Medicine

## 2020-10-09 ENCOUNTER — Ambulatory Visit (INDEPENDENT_AMBULATORY_CARE_PROVIDER_SITE_OTHER): Payer: Managed Care, Other (non HMO) | Admitting: Family Medicine

## 2020-10-09 ENCOUNTER — Other Ambulatory Visit: Payer: Self-pay

## 2020-10-09 VITALS — BP 112/80 | HR 69 | Temp 98.1°F | Ht 63.5 in | Wt 351.5 lb

## 2020-10-09 DIAGNOSIS — Z113 Encounter for screening for infections with a predominantly sexual mode of transmission: Secondary | ICD-10-CM | POA: Diagnosis not present

## 2020-10-09 DIAGNOSIS — Z0000 Encounter for general adult medical examination without abnormal findings: Secondary | ICD-10-CM

## 2020-10-09 DIAGNOSIS — Z124 Encounter for screening for malignant neoplasm of cervix: Secondary | ICD-10-CM

## 2020-10-09 DIAGNOSIS — N926 Irregular menstruation, unspecified: Secondary | ICD-10-CM

## 2020-10-09 DIAGNOSIS — Z1231 Encounter for screening mammogram for malignant neoplasm of breast: Secondary | ICD-10-CM

## 2020-10-09 DIAGNOSIS — Z720 Tobacco use: Secondary | ICD-10-CM

## 2020-10-09 LAB — POCT URINE PREGNANCY: Preg Test, Ur: NEGATIVE

## 2020-10-09 NOTE — Progress Notes (Signed)
[name] DOB: Jul 20, 1971 Encounter date: 10/09/2020  This is a 50 y.o. female who presents for complete physical   History of present illness/Additional concerns: Last visit with me was 03/31/20. At that time she was losing weight. Smoking had increased.   In ER November for chest pain. Negative troponin, EKG. Hasn't had any additional pain since then. Quit drinking soda. Not sure why it happened, but wonders if it was reflux. Was drinking naked juices - has stopped drinking these. Also cut back on pepsi. No abd pain, bowels are ok. Breathing has been ok.   Due for mammogram, pap. We are going to catch her up on this today. Has never had abnormal pap. Last period was October. Still irregular; didn't have period in November or December.   Past Medical History:  Diagnosis Date  . Anemia   . Blood in stool   . Chicken pox   . Endometrial polyp 2003 and 2005  . Menorrhagia   . Morbid obesity (HCC)   . PCOS (polycystic ovarian syndrome)   . Polyp cervix removed twice   Past Surgical History:  Procedure Laterality Date  . Uterine polyp excision     [facility]   No Known Allergies No outpatient medications have been marked as taking for the 10/09/20 encounter (Office Visit) with Wynn Banker, MD.   Social History   Tobacco Use  . Smoking status: Current Every Day Smoker    Packs/day: 0.50    Types: Cigarettes  . Smokeless tobacco: Never Used  Substance Use Topics  . Alcohol use: Yes    Comment: occasional   Family History  Problem Relation Age of Onset  . Hypertension Mother   . Hypertension Maternal Grandmother   . Alzheimer's disease Maternal Grandmother   . Alzheimer's disease Father 38     Review of Systems  Constitutional: Negative for activity change, appetite change, chills, fatigue, fever and unexpected weight change.  HENT: Negative for congestion, ear pain, hearing loss, sinus pressure, sinus pain, sore throat and trouble swallowing.   Eyes:  Negative for pain and visual disturbance.  Respiratory: Negative for cough, chest tightness, shortness of breath and wheezing.   Cardiovascular: Negative for chest pain, palpitations and leg swelling.  Gastrointestinal: Negative for abdominal pain, blood in stool, constipation, diarrhea, nausea and vomiting.  Genitourinary: Negative for difficulty urinating and menstrual problem.  Musculoskeletal: Negative for arthralgias and back pain.  Skin: Negative for rash.  Neurological: Negative for dizziness, weakness, numbness and headaches.  Hematological: Negative for adenopathy. Does not bruise/bleed easily.  Psychiatric/Behavioral: Negative for sleep disturbance and suicidal ideas. The patient is not nervous/anxious.     CBC:  Lab Results  Component Value Date   WBC 9.3 08/29/2020   HGB 13.5 08/29/2020   HCT 42.1 08/29/2020   MCH 27.3 08/29/2020   MCHC 32.1 08/29/2020   RDW 15.6 (H) 08/29/2020   PLT 233 08/29/2020   CMP: Lab Results  Component Value Date   NA 138 08/29/2020   K 4.0 08/29/2020   CL 104 08/29/2020   CO2 27 08/29/2020   ANIONGAP 7 08/29/2020   GLUCOSE 92 08/29/2020   BUN 10 08/29/2020   CREATININE 0.76 08/29/2020   GFRAA >60 09/14/2019   CALCIUM 8.5 (L) 08/29/2020   PROT 7.1 08/29/2020   BILITOT 0.2 (L) 08/29/2020   ALKPHOS 68 08/29/2020   ALT 10 08/29/2020   AST 13 (L) 08/29/2020   LIPID: Lab Results  Component Value Date   CHOL 173 03/31/2020  TRIG 70.0 03/31/2020   HDL 40.10 03/31/2020   LDLCALC 119 (H) 03/31/2020    Objective:  BP 112/80 (BP Location: Right Arm, Patient Position: Sitting, Cuff Size: Large)   Pulse 69   Temp 98.1 F (36.7 C) (Oral)   Ht 5' 3.5" (1.613 m)   Wt (!) 351 lb 8 oz (159.4 kg)   LMP 07/07/2020 (Approximate)   SpO2 98%   BMI 61.29 kg/m   Weight: (!) 351 lb 8 oz (159.4 kg)   BP Readings from Last 3 Encounters:  10/09/20 112/80  08/29/20 119/74  03/31/20 112/80   Wt Readings from Last 3 Encounters:  10/09/20  (!) 351 lb 8 oz (159.4 kg)  03/31/20 (!) 353 lb 6.4 oz (160.3 kg)  12/30/13 (!) 392 lb (177.8 kg)    Physical Exam Exam conducted with a chaperone present.  Constitutional:      General: She is not in acute distress.    Appearance: She is well-developed and well-nourished. She is morbidly obese.  HENT:     Head: Normocephalic and atraumatic.     Right Ear: External ear normal.     Left Ear: External ear normal.     Mouth/Throat:     Mouth: Oropharynx is clear and moist.     Pharynx: No oropharyngeal exudate.  Eyes:     Conjunctiva/sclera: Conjunctivae normal.     Pupils: Pupils are equal, round, and reactive to light.  Neck:     Thyroid: No thyromegaly.  Cardiovascular:     Rate and Rhythm: Normal rate and regular rhythm.     Heart sounds: Normal heart sounds. No murmur heard. No friction rub. No gallop.   Pulmonary:     Effort: Pulmonary effort is normal.     Breath sounds: Normal breath sounds.  Abdominal:     General: Bowel sounds are normal. There is no distension.     Palpations: Abdomen is soft. There is no mass.     Tenderness: There is no abdominal tenderness. There is no guarding.     Hernia: No hernia is present.  Genitourinary:    Exam position: Supine.     Vagina: Normal.     Cervix: Normal.     Uterus: Normal.      Adnexa: Right adnexa normal and left adnexa normal.  Musculoskeletal:        General: No tenderness, deformity or edema. Normal range of motion.     Cervical back: Normal range of motion and neck supple.  Lymphadenopathy:     Cervical: No cervical adenopathy.  Skin:    General: Skin is warm and dry.     Findings: No rash.  Neurological:     Mental Status: She is alert and oriented to person, place, and time.     Deep Tendon Reflexes: Strength normal. Reflexes normal.     Reflex Scores:      Tricep reflexes are 2+ on the right side and 2+ on the left side.      Bicep reflexes are 2+ on the right side and 2+ on the left side.       Brachioradialis reflexes are 2+ on the right side and 2+ on the left side.      Patellar reflexes are 2+ on the right side and 2+ on the left side. Psychiatric:        Mood and Affect: Mood and affect normal.        Speech: Speech normal.  Behavior: Behavior normal.        Thought Content: Thought content normal.     Assessment/Plan: Health Maintenance Due  Topic Date Due  . TETANUS/TDAP  Never done  . PAP SMEAR-Modifier  01/02/2016  . COLONOSCOPY (Pts 45-66yrs Insurance coverage will need to be confirmed)  Never done   Health Maintenance reviewed.  1. Preventative health care Discussed importance of regular exercise, healthy eating, smoking cessation. Mammogram ordered today. Otherwise up to date with preventative health needs.  2. Encounter for screening mammogram for malignant neoplasm of breast - MM DIGITAL SCREENING BILATERAL; Future  3. Irregular menstrual cycle - POCT urine pregnancy  4. Screening for STD (sexually transmitted disease) - Urine cytology ancillary only  5. Cervical cancer screening - PAP [[place]]  6. Tobacco abuse: she has no interest in quitting smoking or cutting back at this point. Encouraged her to reach out if she changes her mind and we will continue to discuss at each visit.   7. Morbid obesity: discussed importance of weight loss for overall health. We briefly discussed ways that we can help her with intervention including [facility]; we discussed that there are some medications now indicated for help with weight loss (she was not overly interested in this); we discussed importance of regular exercise. I have encouraged her to reach out if she feels she needs help on weight loss journey as she is really quite healthy at this point and we discussed that NOW is the best time to work on this important aspect of health.  Return in about 6 months (around 04/08/2021).  Micheline Rough, MD

## 2020-10-09 NOTE — Patient Instructions (Addendum)
Get started back with your exercise program! It will really help your overall health.  Gastroesophageal Reflux Disease, Adult Gastroesophageal reflux (GER) happens when acid from the stomach flows up into the tube that connects the mouth and the stomach (esophagus). Normally, food travels down the esophagus and stays in the stomach to be digested. However, when a person has GER, food and stomach acid sometimes move back up into the esophagus. If this becomes a more serious problem, the person may be diagnosed with a disease called gastroesophageal reflux disease (GERD). GERD occurs when the reflux:  Happens often.  Causes frequent or severe symptoms.  Causes problems such as damage to the esophagus. When stomach acid comes in contact with the esophagus, the acid may cause soreness (inflammation) in the esophagus. Over time, GERD may create small holes (ulcers) in the lining of the esophagus. What are the causes? This condition is caused by a problem with the muscle between the esophagus and the stomach (lower esophageal sphincter, or LES). Normally, the LES muscle closes after food passes through the esophagus to the stomach. When the LES is weakened or abnormal, it does not close properly, and that allows food and stomach acid to go back up into the esophagus. The LES can be weakened by certain dietary substances, medicines, and medical conditions, including:  Tobacco use.  Pregnancy.  Having a hiatal hernia.  Alcohol use.  Certain foods and beverages, such as coffee, chocolate, onions, and peppermint. What increases the risk? You are more likely to develop this condition if you:  Have an increased body weight.  Have a connective tissue disorder.  Use NSAID medicines. What are the signs or symptoms? Symptoms of this condition include:  Heartburn.  Difficult or painful swallowing.  The feeling of having a lump in the throat.  Abitter taste in the mouth.  Bad  breath.  Having a large amount of saliva.  Having an upset or bloated stomach.  Belching.  Chest pain. Different conditions can cause chest pain. Make sure you see your health care provider if you experience chest pain.  Shortness of breath or wheezing.  Ongoing (chronic) cough or a night-time cough.  Wearing away of tooth enamel.  Weight loss. How is this diagnosed? Your health care provider will take a medical history and perform a physical exam. To determine if you have mild or severe GERD, your health care provider may also monitor how you respond to treatment. You may also have tests, including:  A test to examine your stomach and esophagus with a small camera (endoscopy).  A test thatmeasures the acidity level in your esophagus.  A test thatmeasures how much pressure is on your esophagus.  A barium swallow or modified barium swallow test to show the shape, size, and functioning of your esophagus. How is this treated? The goal of treatment is to help relieve your symptoms and to prevent complications. Treatment for this condition may vary depending on how severe your symptoms are. Your health care provider may recommend:  Changes to your diet.  Medicine.  Surgery. Follow these instructions at home: Eating and drinking   Follow a diet as recommended by your health care provider. This may involve avoiding foods and drinks such as: ? Coffee and tea (with or without caffeine). ? Drinks that containalcohol. ? Energy drinks and sports drinks. ? Carbonated drinks or sodas. ? Chocolate and cocoa. ? Peppermint and mint flavorings. ? Garlic and onions. ? Horseradish. ? Spicy and acidic foods, including peppers, chili  powder, curry powder, vinegar, hot sauces, and barbecue sauce. ? Citrus fruit juices and citrus fruits, such as oranges, lemons, and limes. ? Tomato-based foods, such as red sauce, chili, salsa, and pizza with red sauce. ? Fried and fatty foods, such as  donuts, french fries, potato chips, and high-fat dressings. ? High-fat meats, such as hot dogs and fatty cuts of red and white meats, such as rib eye steak, sausage, ham, and bacon. ? High-fat dairy items, such as whole milk, butter, and cream cheese.  Eat small, frequent meals instead of large meals.  Avoid drinking large amounts of liquid with your meals.  Avoid eating meals during the 2-3 hours before bedtime.  Avoid lying down right after you eat.  Do not exercise right after you eat. Lifestyle   Do not use any products that contain nicotine or tobacco, such as cigarettes, e-cigarettes, and chewing tobacco. If you need help quitting, ask your health care provider.  Try to reduce your stress by using methods such as yoga or meditation. If you need help reducing stress, ask your health care provider.  If you are overweight, reduce your weight to an amount that is healthy for you. Ask your health care provider for guidance about a safe weight loss goal. General instructions  Pay attention to any changes in your symptoms.  Take over-the-counter and prescription medicines only as told by your health care provider. Do not take aspirin, ibuprofen, or other NSAIDs unless your health care provider told you to do so.  Wear loose-fitting clothing. Do not wear anything tight around your waist that causes pressure on your abdomen.  Raise (elevate) the head of your bed about 6 inches (15 cm).  Avoid bending over if this makes your symptoms worse.  Keep all follow-up visits as told by your health care provider. This is important. Contact a health care provider if:  You have: ? New symptoms. ? Unexplained weight loss. ? Difficulty swallowing or it hurts to swallow. ? Wheezing or a persistent cough. ? A hoarse voice.  Your symptoms do not improve with treatment. Get help right away if you:  Have pain in your arms, neck, jaw, teeth, or back.  Feel sweaty, dizzy, or  light-headed.  Have chest pain or shortness of breath.  Vomit and your vomit looks like blood or coffee grounds.  Faint.  Have stool that is bloody or black.  Cannot swallow, drink, or eat. Summary  Gastroesophageal reflux happens when acid from the stomach flows up into the esophagus. GERD is a disease in which the reflux happens often, causes frequent or severe symptoms, or causes problems such as damage to the esophagus.  Treatment for this condition may vary depending on how severe your symptoms are. Your health care provider may recommend diet and lifestyle changes, medicine, or surgery.  Contact a health care provider if you have new or worsening symptoms.  Take over-the-counter and prescription medicines only as told by your health care provider. Do not take aspirin, ibuprofen, or other NSAIDs unless your health care provider told you to do so.  Keep all follow-up visits as told by your health care provider. This is important. This information is not intended to replace advice given to you by your health care provider. Make sure you discuss any questions you have with your health care provider. Document Revised: 04/01/2018 Document Reviewed: 04/01/2018 Elsevier Patient Education  2020 [employer].

## 2020-10-10 LAB — URINE CYTOLOGY ANCILLARY ONLY
Bacterial Vaginitis-Urine: NEGATIVE
Candida Urine: NEGATIVE
Chlamydia: NEGATIVE
Comment: NEGATIVE
Comment: NEGATIVE
Comment: NORMAL
Neisseria Gonorrhea: NEGATIVE
Trichomonas: NEGATIVE

## 2020-10-10 LAB — CYTOLOGY - PAP
Comment: NEGATIVE
Diagnosis: NEGATIVE
Diagnosis: REACTIVE
High risk HPV: NEGATIVE

## 2021-07-28 ENCOUNTER — Emergency Department (HOSPITAL_COMMUNITY)
Admission: EM | Admit: 2021-07-28 | Discharge: 2021-07-28 | Disposition: A | Discharge status: Home / Self Care | Payer: Managed Care, Other (non HMO) | Attending: Emergency Medicine | Admitting: Emergency Medicine

## 2021-07-28 ENCOUNTER — Emergency Department (HOSPITAL_COMMUNITY): Payer: Managed Care, Other (non HMO)

## 2021-07-28 ENCOUNTER — Encounter (HOSPITAL_COMMUNITY): Payer: Self-pay

## 2021-07-28 DIAGNOSIS — F17210 Nicotine dependence, cigarettes, uncomplicated: Secondary | ICD-10-CM | POA: Diagnosis not present

## 2021-07-28 DIAGNOSIS — Y92410 Unspecified street and highway as the place of occurrence of the external cause: Secondary | ICD-10-CM | POA: Insufficient documentation

## 2021-07-28 DIAGNOSIS — M25571 Pain in right ankle and joints of right foot: Secondary | ICD-10-CM | POA: Diagnosis not present
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
## 2021-07-28 NOTE — Discharge Instructions (Addendum)
Please use Tylenol or ibuprofen for pain.  You may use 600 mg ibuprofen every 6 hours or 1000 mg of Tylenol every 6 hours.  You may choose to alternate between the 2.  This would be most effective.  Not to exceed 4 g of Tylenol within 24 hours.  Not to exceed 3200 mg ibuprofen 24 hours.  

## 2022-04-18 ENCOUNTER — Telehealth: Payer: Self-pay

## 2022-04-18 NOTE — Telephone Encounter (Signed)
---  Caller states she has pain under her left breast when she breathes in deeply. She heard a crack when she leaned over the arm of her chair to pick up a phone from the floor 11:30. pt states that her pain is a 0 when sitting and not moving but when moving a 6 on a scale of 1-10. pain is reproducible when moving  04/17/2022 4:29:11 PM [facility] [place], RN, [facility] Advice Given Per Guideline HOME CARE: * You should be able to treat this at home. REASSURANCE AND EDUCATION: * It sounds like you overstretched the muscles in your chest or between your ribs. Another term for this is muscle strain. This can happen with lifting, twisting, or bending. * It is usually not serious, but it can be painful. Often the pain is worse with certain movements and sometimes with deep breathing. * There are things that you can do to help it heal and decrease the pain. * Here is some care advice that should help. USE A COLD PACK FOR PAIN, SWELLING, OR BRUISING: * Put a cold pack or an ice bag (wrapped in a moist towel) on the area for 20 minutes. * Repeat in 1 hour, then every 4 hours while awake. * Continue this for the first 48 hours (2 days) after an injury. * This will help decrease pain, swelling, and bruising. USE HEAT ON AREA AFTER 48 HOURS: * If pain, swelling, or bruising last more than 48 hours (2 days), then use heat on the area. * Use a heat pack, heating pad, or warm wet washcloth. * Do this for 10 minutes three times a day. * This will help increase blood flow and improve healing. * Caution: Avoid burn. Do not sleep on a heating pad. PAIN MEDICINES: * For pain relief, you can take either acetaminophen, ibuprofen, or naproxen. * Continue ordinary activities as much as your pain permits. Continued activity is more healing for muscles than rest. * Avoid any activities that cause severe pain. * Avoid strenuous activities or contact sports until the pain is gone. * Complete bed rest is not needed.  GOOD BODY MECHANICS: * Lifting: Stand close to the object to be lifted. Keep your back straight and lift by bending your legs. Ask for lifting help if needed. Avoid doing twisting and lifting movements at the same time. * Posture: Maintain good posture. EXPECTED COURSE: * Pain from muscle strain and irritation can sometimes last 1 to 2 weeks. CALL BACK IF: * Severe pain persists over 2 hours after pain medicine and ice * Difficulty breathing occurs * Pain lasts more than 7 days (1 week) * You become worse CARE ADVICE given per Chest Injury - Bending, Lifting, or Twisting (Adult) guideline.   04/18/22 1411 - Pt states she feeling better following the home care instructions above. Of note her last OV was CPE on 10/09/20. Pt notified of this & is aware that [facility] is no longer with practice. She states she will TOC to [facility] but needs to pay out of pocket. Will inquire about cost of TOC vs CPE & call pt back.

## 2022-05-28 NOTE — Telephone Encounter (Signed)
Pt has TOC appt scheduled 08/12/22. Is aware of the out of pocket costs as discussed with front office staff.

## 2022-07-11 ENCOUNTER — Emergency Department (HOSPITAL_COMMUNITY)
Admission: EM | Admit: 2022-07-11 | Discharge: 2022-07-11 | Disposition: A | Discharge status: Home / Self Care | Payer: Self-pay | Attending: Emergency Medicine | Admitting: Emergency Medicine

## 2022-07-11 ENCOUNTER — Emergency Department (HOSPITAL_COMMUNITY): Payer: Self-pay

## 2022-07-11 ENCOUNTER — Other Ambulatory Visit: Payer: Self-pay

## 2022-07-11 DIAGNOSIS — F17200 Nicotine dependence, unspecified, uncomplicated: Secondary | ICD-10-CM | POA: Insufficient documentation

## 2022-07-11 DIAGNOSIS — R040 Epistaxis: Secondary | ICD-10-CM | POA: Insufficient documentation

## 2022-07-11 DIAGNOSIS — J069 Acute upper respiratory infection, unspecified: Secondary | ICD-10-CM | POA: Insufficient documentation

## 2022-07-11 DIAGNOSIS — Z20822 Contact with and (suspected) exposure to covid-19: Secondary | ICD-10-CM | POA: Insufficient documentation

## 2022-07-11 LAB — RESP PANEL BY RT-PCR (FLU A&B, COVID) ARPGX2
Influenza A by PCR: NEGATIVE
Influenza B by PCR: NEGATIVE
SARS Coronavirus 2 by RT PCR: NEGATIVE

## 2022-07-11 MED ORDER — GUAIFENESIN ER 1200 MG PO TB12: 1.0000 | ORAL_TABLET | Freq: Two times a day (BID) | ORAL | 0 refills | Status: DC

## 2022-07-11 MED ORDER — FLUTICASONE PROPIONATE 50 MCG/ACT NA SUSP: 1.0000 | Freq: Every day | NASAL | 2 refills | Status: DC

## 2022-07-11 NOTE — ED Triage Notes (Signed)
Patient coming to ED for evaluation of nose bleed.  Reports she has been coughing since Monday.  States she is producing mucus.  No fevers.

## 2022-07-11 NOTE — Discharge Instructions (Addendum)
Take the medications to help with the cough and congestion.  In addition to the medications you can also try saline nasal spray to help keep your nose moist and prevent recurrent nosebleeds.  Follow-up with your primary care doctor to be rechecked if the symptoms do not resolve in the next week.

## 2022-07-11 NOTE — ED Provider Notes (Signed)
[facility] DEPT Provider Note   CSN: [id number] Arrival date & time: 07/11/22  0435     History  Chief Complaint  Patient presents with   Epistaxis    [name] is a 51 y.o. female.   Epistaxis    Patient has a history of morbid obesity, polycystic ovarian syndrome anemia who presents to the ED with complaints of cough URI symptoms nosebleed this morning.  Patient states she started having some cough and congestion this past week.  Patient is a chronic smoker so she was not too concerned about it.  She did feel like she had a cold though because she did have some sneezing and nasal congestion.  She has been bringing up some mucus that is occasionally cloudy.  Last night she woke up and had a nosebleed.  Patient held her head backwards and then it started going down the back of her throat so this concerned her.  She came to the ED for evaluation.  The bleeding has subsequently stopped.  She is not feeling short of breath.  She is not having any chest pain.  Home Medications Prior to Admission medications   Medication Sig Start Date End Date Taking? Authorizing Provider  fluticasone (FLONASE) 50 MCG/ACT nasal spray Place 1 spray into both nostrils daily. 07/11/22  Yes Dorie Rank, MD  Guaifenesin 1200 MG TB12 Take 1 tablet (1,200 mg total) by mouth 2 (two) times daily at 10 AM and 5 PM. 07/11/22  Yes Dorie Rank, MD      Allergies    Patient has no known allergies.    Review of Systems   Review of Systems  HENT:  Positive for nosebleeds.     Physical Exam Updated Vital Signs BP (!) 143/75 (BP Location: Left Arm) Comment (BP Location): forearm  Pulse 75   Temp 99.3 F (37.4 C) (Oral)   Resp 18   Ht 1.626 m (5\' 4" )   Wt (!) 150.6 kg   LMP 06/24/2022   SpO2 98%   BMI 56.99 kg/m  Physical Exam Vitals and nursing note reviewed.  Constitutional:      General: She is not in acute distress.    Appearance: She is well-developed. She is  obese.  HENT:     Head: Normocephalic and atraumatic.     Right Ear: External ear normal.     Left Ear: External ear normal.     Nose: Nose normal. No congestion.     Comments: No nasal bleeding noted    Mouth/Throat:     Pharynx: No posterior oropharyngeal erythema.  Eyes:     General: No scleral icterus.       Right eye: No discharge.        Left eye: No discharge.     Conjunctiva/sclera: Conjunctivae normal.  Neck:     Trachea: No tracheal deviation.  Cardiovascular:     Rate and Rhythm: Normal rate.  Pulmonary:     Effort: Pulmonary effort is normal. No respiratory distress.     Breath sounds: No stridor. No wheezing.  Abdominal:     General: There is no distension.  Musculoskeletal:        General: No swelling or deformity.     Cervical back: Neck supple.  Skin:    General: Skin is warm and dry.     Findings: No rash.  Neurological:     Mental Status: She is alert.     Cranial Nerves: Cranial nerve deficit: no  gross deficits.     ED Results / Procedures / Treatments   Labs (all labs ordered are listed, but only abnormal results are displayed) Labs Reviewed  RESP PANEL BY RT-PCR (FLU A&B, COVID) [id number]    EKG None  Radiology DG Chest 2 View  Result Date: 07/11/2022 CLINICAL DATA:  Nonproductive cough.  Nose bleed EXAM: CHEST - 2 VIEW COMPARISON:  08/29/2020 FINDINGS: Normal heart size and stable mediastinal/hilar contours. No acute infiltrate or edema. No effusion or pneumothorax. No acute osseous findings. IMPRESSION: Stable exam.  No evidence of active disease. Electronically Signed   By: Jorje Guild M.D.   On: 07/11/2022 06:13    Procedures Procedures    Medications Ordered in ED Medications - No data to display  ED Course/ Medical Decision Making/ A&P Clinical Course as of 07/11/22 0834  Thu Jul 11, 2022  0822 X-ray without acute findings, COVID and flu negative [JK]    Clinical Course User Index [JK] Dorie Rank, MD                            Medical Decision Making Problems Addressed: Epistaxis: acute illness or injury Upper respiratory tract infection, unspecified type: acute illness or injury  Amount and/or Complexity of Data Reviewed Radiology: ordered and independent interpretation performed.  Risk OTC drugs.   Patient presented to the ED with URI type symptoms nosebleed.  ED work-up reassuring.  She is not having any respiratory difficulty.  No persistent epistaxis.  X-ray does not show any signs of pneumonia.  COVID and flu is negative.  Suspect viral etiology.  Recommended smoking cessation.  Will discharge home with Flonase and Mucinex to help with her symptoms.  Evaluation and diagnostic testing in the emergency department does not suggest an emergent condition requiring admission or immediate intervention beyond what has been performed at this time.  The patient is safe for discharge and has been instructed to return immediately for worsening symptoms, change in symptoms or any other concerns.        Final Clinical Impression(s) / ED Diagnoses Final diagnoses:  Epistaxis  Upper respiratory tract infection, unspecified type    Rx / DC Orders ED Discharge Orders          Ordered    fluticasone (FLONASE) 50 MCG/ACT nasal spray  Daily        07/11/22 0833    Guaifenesin 1200 MG TB12  2 times daily        07/11/22 [id number]              Dorie Rank, MD 07/11/22 [phone]

## 2022-07-11 NOTE — ED Provider Triage Note (Signed)
Emergency Medicine Provider Triage Evaluation Note  [name] , a 51 y.o. female  was evaluated in triage.  Pt complains of cough and nosebleed.  [name] out of town over the weekend and felt like she came down with a cold.  Woke up this morning at 3am with nosebleed.  Has taken OTC cold meds without change.  She is a smoker.  Denies chest pain.  Review of Systems  Positive: Cough, nosebleed Negative: fever  Physical Exam  BP (!) 143/75 (BP Location: Left Arm) Comment (BP Location): forearm  Pulse 75   Temp 99.3 F (37.4 C) (Oral)   Resp 18   Ht 5\' 4"  (1.626 m)   Wt (!) 150.6 kg   SpO2 98%   BMI 56.99 kg/m  Gen:   Awake, no distress   Resp:  Normal effort  MSK:   Moves extremities without difficulty  Other:  Dried blood in right nostril, no active bleeding  Medical Decision Making  Medically screening exam initiated at 5:54 AM.  Appropriate orders placed.  [name] was informed that the remainder of the evaluation will be completed by another provider, this initial triage assessment does not replace that evaluation, and the importance of remaining in the ED until their evaluation is complete.  Cough, nosebleed.  Recent URI symptoms.  Will check covid screen, CXR.   [name], PA-C 07/11/22 [phone]

## 2022-07-11 NOTE — ED Notes (Signed)
Pt ambulatory to restroom w/out assistance. Provided pt with specimen cup and asked her to try to provide sample just in case it is needed.

## 2022-07-11 NOTE — ED Notes (Signed)
Pt has a urine sample in main lab sent with save label.

## 2022-07-12 ENCOUNTER — Telehealth: Payer: Self-pay

## 2022-07-12 NOTE — Telephone Encounter (Signed)
--  Caller states she is experiencing chest congestion coughing and nose bleeds. Coughing for a month, caller attributed to smoking. Had a coughing fit yesterday and went to restroom to wipe nose, had nose bleed from nose and mouth. Was prescribed flonase and guanafacine from ED. Was calling to see if she can take theraflu/ robitussin. Was tested for Covid in ED is negative, does not have fever.  07/12/2022 9:48:59 AM See PCP within 24 Hours Patsey Berthold, RN, Roma Kayser  Comments User: Diana Eves, RN Date/Time Eilene Ghazi Time): 07/12/2022 9:35:47 AM Has productive cough with a little blood in sputum. Has some ear congestion where she hears an echo to both ears, started yesterday. Caller states that it feels stuffy like water in ears. Not in pain.  User: Diana Eves, RN Date/Time Eilene Ghazi Time): 07/12/2022 9:43:57 AM Has been using robitussin DM, when went to ER yesterday, mucinex is 1200mg - has only taken twice- is taking BID. wants to know is she can take mucinex and robitussin together.  User: Diana Eves, RN Date/Time Eilene Ghazi Time): 07/12/2022 9:48:51 AM Asks if she can take TheraFlu, it was explained that TheraFlu may have the same ingredients as robitussin (Dextromethorphan) and that she should take one or the other, to make sure she is not doubling up on any medication. Was explained that she can take mucinex and dextromethorphan together.  Referrals REFERRED TO PCP OFFICE  Pt was advised to f/u with pharmacy since she has currently not been seen by new provider. Pt verb understanding & denies further ques/concerns. Of note: she has TOC appt with Dr Legrand Como on 08/12/22. Last OV 10/09/20

## 2022-08-12 ENCOUNTER — Ambulatory Visit (INDEPENDENT_AMBULATORY_CARE_PROVIDER_SITE_OTHER): Payer: Self-pay | Admitting: Family Medicine

## 2022-08-12 ENCOUNTER — Encounter: Payer: Self-pay | Admitting: Family Medicine

## 2022-08-12 VITALS — BP 100/68 | HR 65 | Temp 98.3°F | Ht 64.0 in | Wt 338.0 lb

## 2022-08-12 DIAGNOSIS — J0100 Acute maxillary sinusitis, unspecified: Secondary | ICD-10-CM

## 2022-08-12 DIAGNOSIS — E282 Polycystic ovarian syndrome: Secondary | ICD-10-CM

## 2022-08-12 DIAGNOSIS — J029 Acute pharyngitis, unspecified: Secondary | ICD-10-CM

## 2022-08-12 LAB — COMPREHENSIVE METABOLIC PANEL
ALT: 7 U/L (ref 0–35)
AST: 13 U/L (ref 0–37)
Albumin: 3.8 g/dL (ref 3.5–5.2)
Alkaline Phosphatase: 84 U/L (ref 39–117)
BUN: 10 mg/dL (ref 6–23)
CO2: 28 mEq/L (ref 19–32)
Calcium: 9.2 mg/dL (ref 8.4–10.5)
Chloride: 105 mEq/L (ref 96–112)
Creatinine, Ser: 0.7 mg/dL (ref 0.40–1.20)
GFR: 101.37 mL/min (ref >60.00)
Glucose, Bld: 77 mg/dL (ref 70–99)
Potassium: 4.2 mEq/L (ref 3.5–5.1)
Sodium: 140 mEq/L (ref 135–145)
Total Bilirubin: 0.4 mg/dL (ref 0.2–1.2)
Total Protein: 7.3 g/dL (ref 6.0–8.3)

## 2022-08-12 LAB — LIPID PANEL
Cholesterol: 167 mg/dL (ref 0–200)
HDL: 46.3 mg/dL (ref >39.00)
LDL Cholesterol: 106 mg/dL — ABNORMAL HIGH (ref 0–99)
NonHDL: 121.05
Total CHOL/HDL Ratio: 4
Triglycerides: 75 mg/dL (ref 0.0–149.0)
VLDL: 15 mg/dL (ref 0.0–40.0)

## 2022-08-12 LAB — HEMOGLOBIN A1C: Hgb A1c MFr Bld: 5.4 % (ref 4.6–6.5)

## 2022-08-12 LAB — POCT RAPID STREP A (OFFICE): Rapid Strep A Screen: POSITIVE — AB

## 2022-08-12 LAB — TSH: TSH: 2.36 u[IU]/mL (ref 0.35–5.50)

## 2022-08-12 MED ORDER — AMOXICILLIN-POT CLAVULANATE 875-125 MG PO TABS: 1.0000 | ORAL_TABLET | Freq: Two times a day (BID) | ORAL | 0 refills | Status: DC

## 2022-08-12 NOTE — Progress Notes (Signed)
Established Patient Office Visit  Subjective   Patient ID: [name], female    DOB: [birth year]  Age: 51 y.o. MRN: [id number]  Chief Complaint  Patient presents with  . Establish Care    Patient is here for transition of care visit.  Patient reports that she went to the ER last month with a nosebleed and URI. She states that in August she was having a lot of sore throat, like it was burning. Then she started coughing a lot. States that she thought initially it was from the smoking but then the nosebleed occurred. States that she took the medication they gave her. her CXR was negative and her flu and covid was also negative. States no more noesbleeds but she is still having chest congestion, coughing and sinus congestion. No fever or chills, some mild chest pain, productive cough, also reporting maxillary and frontal tenderness/pain.    Patient Active Problem List   Diagnosis Date Noted  . Smoker unmotivated to quit 12/30/2013  . Morning headache 12/30/2013  . PCOS (polycystic ovarian syndrome) 01/01/2013  . General medical examination 10/15/2011  . Obesity 10/15/2011   Current Outpatient Medications  Medication Instructions  . amoxicillin-clavulanate (AUGMENTIN) 875-125 MG tablet 1 tablet, Oral, 2 times daily       Review of Systems  All other systems reviewed and are negative.     Objective:     BP 100/68 (BP Location: Left Arm, Patient Position: Sitting, Cuff Size: Large)   Pulse 65   Temp 98.3 F (36.8 C) (Oral)   Ht 5\' 4"  (1.626 m)   Wt (!) 338 lb (153.3 kg)   LMP 08/05/2022 (Exact Date)   SpO2 99%   BMI 58.02 kg/m  {Vitals History (Optional):23777}  Physical Exam Vitals reviewed.  Constitutional:      Appearance: Normal appearance. She is well-groomed. She is morbidly obese.  Eyes:     Conjunctiva/sclera: Conjunctivae normal.  Neck:     Thyroid: No thyromegaly.  Cardiovascular:     Rate and Rhythm: Normal rate and regular rhythm.     Pulses:  Normal pulses.     Heart sounds: S1 normal and S2 normal.  Pulmonary:     Effort: Pulmonary effort is normal.     Breath sounds: Normal breath sounds and air entry.  Abdominal:     General: Bowel sounds are normal.  Musculoskeletal:     Right lower leg: No edema.     Left lower leg: No edema.  Neurological:     Mental Status: She is alert and oriented to person, place, and time. Mental status is at baseline.     Gait: Gait is intact.  Psychiatric:        Mood and Affect: Mood and affect normal.        Speech: Speech normal.        Behavior: Behavior normal.        Judgment: Judgment normal.     Results for orders placed or performed in visit on 08/12/22  POC Rapid Strep A  Result Value Ref Range   Rapid Strep A Screen Positive (A) Negative    {Labs (Optional):23779}  The 10-year ASCVD risk score (Arnett DK, et al., 2019) is: 1.7%    Assessment & Plan:   Problem List Items Addressed This Visit       Endocrine   PCOS (polycystic ovarian syndrome)   Relevant Orders   CMP   Lipid Panel   TSH   Hemoglobin A1c  Other Visit Diagnoses     Acute sore throat    -  Primary   Relevant Orders   POC Rapid Strep A (Completed)   Acute non-recurrent maxillary sinusitis       Relevant Medications   amoxicillin-clavulanate (AUGMENTIN) 875-125 MG tablet       No follow-ups on file.    Karie Georges, MD

## 2022-08-13 NOTE — Progress Notes (Signed)
Labs normal, LDL slightly high but medication is not indicated at this time.

## 2022-08-14 NOTE — Assessment & Plan Note (Signed)
Will check her annual labs today. Further orders TBD.
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
## 2023-05-07 ENCOUNTER — Ambulatory Visit (INDEPENDENT_AMBULATORY_CARE_PROVIDER_SITE_OTHER): Payer: Self-pay | Admitting: Family Medicine

## 2023-05-07 ENCOUNTER — Encounter: Payer: Self-pay | Admitting: Family Medicine

## 2023-05-07 VITALS — BP 116/76 | HR 69 | Temp 98.4°F | Ht 64.0 in | Wt 341.6 lb

## 2023-05-07 DIAGNOSIS — R1012 Left upper quadrant pain: Secondary | ICD-10-CM

## 2023-05-07 DIAGNOSIS — R09A2 Foreign body sensation, throat: Secondary | ICD-10-CM

## 2023-05-07 NOTE — Progress Notes (Signed)
Established Patient Office Visit  Subjective   Patient ID: [name], female    DOB: Jan 28, 1971  Age: 52 y.o. MRN: [id number]  Chief Complaint  Patient presents with   Sore Throat    Patient complains of sore throat, x1 month    HPI   [name] is seen with 1 month history of globus type sensation in her lower neck and upper esophageal region.  She states she feels "like something is stuck in her throat ".  Denies any choking episodes.  No dysphagia.  No odynophagia.  Appetite is good.  No recent weight loss.  No dyspnea.  She has occasional cough but no chronic cough.  No active GERD symptoms.  No difficulty with liquids or solids.  No history of similar symptoms.  She has not noted any neck adenopathy or thyroid nodules.  Does not take any regular medications.  She does smoke about a pack of cigarettes per day.  No chronic cough.  No recent hemoptysis.    She also had onset yesterday of some fleeting left side pain really more under her left breast region.  Went away and is returned with mild intensity this morning.  No pleuritic pain.  No change in stool habits.  No soreness to touch.  No rashes.  Past Medical History:  Diagnosis Date   Anemia    Blood in stool    Chicken pox    Endometrial polyp 2003 and 2005   Menorrhagia    Morbid obesity (HCC)    PCOS (polycystic ovarian syndrome)    Polyp cervix removed twice   Past Surgical History:  Procedure Laterality Date   Uterine polyp excision     [facility]    reports that she has been smoking cigarettes. She has never used smokeless tobacco. She reports current alcohol use. She reports that she does not use drugs. family history includes Alzheimer's disease in her maternal grandmother; Alzheimer's disease (age of onset: 94) in her father; Hypertension in her maternal grandmother and mother. No Known Allergies  Review of Systems  Constitutional:  Negative for chills, fever and weight loss.  HENT:  Negative for  congestion.   Respiratory:  Negative for hemoptysis, shortness of breath and wheezing.   Cardiovascular:  Negative for chest pain.  Gastrointestinal:  Negative for blood in stool, constipation, diarrhea, melena, nausea and vomiting.      Objective:     BP 116/76 (BP Location: Left Arm, Patient Position: Sitting, Cuff Size: Large)   Pulse 69   Temp 98.4 F (36.9 C) (Oral)   Ht 5\' 4"  (1.626 m)   Wt (!) 341 lb 9.6 oz (154.9 kg)   SpO2 97%   BMI 58.64 kg/m  BP Readings from Last 3 Encounters:  05/07/23 116/76  08/12/22 100/68  07/11/22 134/86   Wt Readings from Last 3 Encounters:  05/07/23 (!) 341 lb 9.6 oz (154.9 kg)  08/12/22 (!) 338 lb (153.3 kg)  07/11/22 (!) 332 lb (150.6 kg)      Physical Exam Vitals reviewed.  Constitutional:      General: She is not in acute distress.    Appearance: She is obese.  HENT:     Head: Normocephalic and atraumatic.     Mouth/Throat:     Mouth: Mucous membranes are moist.     Pharynx: Oropharynx is clear.  Neck:     Comments: Neck reveals no adenopathy and no palpable thyroid nodules. Cardiovascular:     Rate and Rhythm:  Normal rate and regular rhythm.     Heart sounds: Normal heart sounds.  Pulmonary:     Effort: Pulmonary effort is normal.     Breath sounds: Normal breath sounds. No wheezing or rales.  Abdominal:     General: There is no distension.     Palpations: Abdomen is soft. There is no mass.     Tenderness: There is no abdominal tenderness. There is no guarding.  Neurological:     Mental Status: She is alert.      No results found for any visits on 05/07/23.  Last CBC Lab Results  Component Value Date   WBC 9.3 08/29/2020   HGB 13.5 08/29/2020   HCT 42.1 08/29/2020   MCV 85.1 08/29/2020   MCH 27.3 08/29/2020   RDW 15.6 (H) 08/29/2020   PLT 233 08/29/2020   Last metabolic panel Lab Results  Component Value Date   GLUCOSE 77 08/12/2022   NA 140 08/12/2022   K 4.2 08/12/2022   CL 105 08/12/2022   CO2  28 08/12/2022   BUN 10 08/12/2022   CREATININE 0.70 08/12/2022   GFR 101.37 08/12/2022   CALCIUM 9.2 08/12/2022   PROT 7.3 08/12/2022   ALBUMIN 3.8 08/12/2022   BILITOT 0.4 08/12/2022   ALKPHOS 84 08/12/2022   AST 13 08/12/2022   ALT 7 08/12/2022   ANIONGAP 7 08/29/2020   Last lipids Lab Results  Component Value Date   CHOL 167 08/12/2022   HDL 46.30 08/12/2022   LDLCALC 106 (H) 08/12/2022   TRIG 75.0 08/12/2022   CHOLHDL 4 08/12/2022   Last hemoglobin A1c Lab Results  Component Value Date   HGBA1C 5.4 08/12/2022   Last thyroid functions Lab Results  Component Value Date   TSH 2.36 08/12/2022      The 10-year ASCVD risk score (Arnett DK, et al., 2019) is: 2.8%    Assessment & Plan:   #1    1 month history of globus sensation upper esophageal region.  She does not have any red flags such as appetite change, weight loss, or any solid food or liquid dysphagia.  No concerning abnormal physical findings.  Etiology unclear  -Given duration of symptoms set up barium swallow study to further assess.  If unremarkable and symptoms persist consider ENT referral  #2 fleeting left upper abdominal pain < one day duration.  Nonfocal exam.  Benign abdominal exam.  Question gaseous.  Observe for now.  Follow-up promptly for any worsening or persistent pain, change in stool habits, or other concerns.     No follow-ups on file.    Evelena Peat, MD

## 2023-05-08 ENCOUNTER — Ambulatory Visit
Admission: RE | Admit: 2023-05-08 | Discharge: 2023-05-08 | Disposition: A | Discharge status: Home / Self Care | Payer: Self-pay | Source: Ambulatory Visit | Attending: Family Medicine | Admitting: Family Medicine

## 2023-05-08 DIAGNOSIS — R09A2 Foreign body sensation, throat: Secondary | ICD-10-CM

## 2023-07-11 ENCOUNTER — Other Ambulatory Visit: Payer: Self-pay | Admitting: Family Medicine

## 2023-07-11 DIAGNOSIS — Z1212 Encounter for screening for malignant neoplasm of rectum: Secondary | ICD-10-CM

## 2023-07-11 DIAGNOSIS — Z1211 Encounter for screening for malignant neoplasm of colon: Secondary | ICD-10-CM

## 2024-01-19 ENCOUNTER — Emergency Department (HOSPITAL_COMMUNITY): Payer: Self-pay

## 2024-01-19 ENCOUNTER — Emergency Department (HOSPITAL_COMMUNITY)
Admission: EM | Admit: 2024-01-19 | Discharge: 2024-01-19 | Disposition: A | Discharge status: Home / Self Care | Payer: Self-pay | Attending: Emergency Medicine | Admitting: Emergency Medicine

## 2024-01-19 ENCOUNTER — Other Ambulatory Visit: Payer: Self-pay

## 2024-01-19 ENCOUNTER — Encounter (HOSPITAL_COMMUNITY): Payer: Self-pay

## 2024-01-19 DIAGNOSIS — M75101 Unspecified rotator cuff tear or rupture of right shoulder, not specified as traumatic: Secondary | ICD-10-CM | POA: Insufficient documentation

## 2024-01-19 DIAGNOSIS — M19011 Primary osteoarthritis, right shoulder: Secondary | ICD-10-CM | POA: Insufficient documentation

## 2024-01-19 DIAGNOSIS — M7581 Other shoulder lesions, right shoulder: Secondary | ICD-10-CM

## 2024-01-19 DIAGNOSIS — M19019 Primary osteoarthritis, unspecified shoulder: Secondary | ICD-10-CM

## 2024-01-19 MED ORDER — NAPROXEN 500 MG PO TABS: 500.0000 mg | ORAL_TABLET | Freq: Two times a day (BID) | ORAL | 0 refills | Status: DC

## 2024-01-19 MED ORDER — KETOROLAC TROMETHAMINE 60 MG/2ML IM SOLN
30.0000 mg | Freq: Once | INTRAMUSCULAR | Status: AC
  Administered 2024-01-19: 30 mg via INTRAMUSCULAR
  Filled 2024-01-19: qty 2

## 2024-01-19 NOTE — Discharge Instructions (Addendum)
 Your shoulder XR revealed: IMPRESSION:  Calcific tendinitis of the rotator cuff.    AC joint osteoarthritis.   Take NSAIDs for pain control. A shoulder sling has been provided for comfort.  Call your PCP for continued pain management if needed, follow-up with orthopedics.

## 2024-01-19 NOTE — ED Provider Notes (Signed)
 [name] EMERGENCY DEPARTMENT AT [facility] Provider Note   CSN: [id number] Arrival date & time: 01/19/24  0746     History  Chief Complaint  Patient presents with   Shoulder Pain    [name] is a 53 y.o. female.   Shoulder Pain    53 year old female with medical history significant for PCOS, morbid obesity presenting to the Emergency Department with right shoulder pain for the past 2 weeks.  The patient denies any falls or specific injury to her right shoulder.  She endorses pain, worse with range of motion and attempts at movement.  No swelling.  Home Medications Prior to Admission medications   Medication Sig Start Date End Date Taking? Authorizing Provider  naproxen (NAPROSYN) 500 MG tablet Take 1 tablet (500 mg total) by mouth 2 (two) times daily. 01/19/24  Yes Ernie Avena, MD      Allergies    Patient has no known allergies.    Review of Systems   Review of Systems  Musculoskeletal:  Positive for arthralgias.  All other systems reviewed and are negative.   Physical Exam Updated Vital Signs BP (!) 144/77   Pulse (!) 58   Temp 97.8 F (36.6 C) (Oral)   Resp 17   SpO2 100%  Physical Exam Vitals and nursing note reviewed.  Constitutional:      General: She is not in acute distress.    Appearance: She is obese.  HENT:     Head: Normocephalic and atraumatic.  Eyes:     Conjunctiva/sclera: Conjunctivae normal.     Pupils: Pupils are equal, round, and reactive to light.  Cardiovascular:     Rate and Rhythm: Normal rate and regular rhythm.  Pulmonary:     Effort: Pulmonary effort is normal. No respiratory distress.  Abdominal:     General: There is no distension.     Tenderness: There is no guarding.  Musculoskeletal:        General: Tenderness present. No deformity or signs of injury.     Cervical back: Neck supple.     Comments: Positive empty can test on the right, neurovascular intact in the right upper extremity, tenderness  to palpation about the biceps tendon, pain with attempts at range of motion of the right shoulder  Skin:    Findings: No lesion or rash.  Neurological:     General: No focal deficit present.     Mental Status: She is alert. Mental status is at baseline.     ED Results / Procedures / Treatments   Labs (all labs ordered are listed, but only abnormal results are displayed) Labs Reviewed - No data to display  EKG None  Radiology DG Shoulder Right Portable Result Date: 01/19/2024 CLINICAL DATA:  Atraumatic right shoulder pain EXAM: RIGHT SHOULDER - 1 VIEW COMPARISON:  None Available. FINDINGS: Calcification of the rotator cuff. No acute fracture or dislocation. AC joint osteoarthritis with narrowing and mild inferior spurring. Clear right lung. IMPRESSION: Calcific tendinitis of the rotator cuff. AC joint osteoarthritis. Electronically Signed   By: Tiburcio Pea M.D.   On: 01/19/2024 08:27    Procedures Procedures    Medications Ordered in ED Medications  ketorolac (TORADOL) injection 30 mg (has no administration in time range)    ED Course/ Medical Decision Making/ A&P  Medical Decision Making Amount and/or Complexity of Data Reviewed Radiology: ordered.  Risk Prescription drug management.     53 year old female with medical history significant for PCOS, morbid obesity presenting to the Emergency Department with right shoulder pain for the past 2 weeks.  The patient denies any falls or specific injury to her right shoulder.  She endorses pain, worse with range of motion and attempts at movement.  No swelling.  On arrival, the patient was vitally stable.  Presenting with 2 weeks of shoulder pain without clear injury.  X-ray imaging revealed rotator cuff tendinitis as well as AC joint osteoarthritis.  Patient was placed in a shoulder sling for comfort, provided Toradol for pain control.  She is not on anticoagulation, denies any history of  peptic ulcer disease or CKD.  A referral was for follow-up outpatient with orthopedics.  Patient advised NSAIDs for pain control and close follow-up, advised to call her PCP for continued pain management if needed.   Final Clinical Impression(s) / ED Diagnoses Final diagnoses:  Tendinitis of right rotator cuff  Osteoarthritis of AC (acromioclavicular) joint    Rx / DC Orders ED Discharge Orders          Ordered    AMB referral to orthopedics        01/19/24 1141    naproxen (NAPROSYN) 500 MG tablet  2 times daily        01/19/24 1142              Rosealee Concha, MD 01/19/24 1144

## 2024-01-19 NOTE — ED Triage Notes (Signed)
 Pt arrived reporting R shoulder pain. Denies known injury. Pt has full ROM to R shoulder. No bruising/swelling noted.

## 2024-07-02 ENCOUNTER — Encounter: Payer: Self-pay | Admitting: Family Medicine

## 2024-07-02 ENCOUNTER — Ambulatory Visit: Payer: Self-pay | Admitting: Family Medicine

## 2024-07-02 VITALS — BP 120/78 | HR 80 | Temp 98.2°F | Ht 64.0 in | Wt 315.4 lb

## 2024-07-02 DIAGNOSIS — Z1231 Encounter for screening mammogram for malignant neoplasm of breast: Secondary | ICD-10-CM

## 2024-07-02 DIAGNOSIS — Z1211 Encounter for screening for malignant neoplasm of colon: Secondary | ICD-10-CM

## 2024-07-02 DIAGNOSIS — Z0000 Encounter for general adult medical examination without abnormal findings: Secondary | ICD-10-CM

## 2024-07-02 DIAGNOSIS — E66813 Obesity, class 3: Secondary | ICD-10-CM

## 2024-07-02 DIAGNOSIS — Z6841 Body Mass Index (BMI) 40.0 and over, adult: Secondary | ICD-10-CM

## 2024-07-02 NOTE — Patient Instructions (Signed)

## 2024-07-02 NOTE — Progress Notes (Signed)
 Complete physical exam  Patient: [name]   DOB: 1971/09/15   53 y.o. Female  MRN: [id number]  Subjective:    Chief Complaint  Patient presents with   Annual Exam    [name] is a 53 y.o. female who presents today for a complete physical exam. She reports consuming a general diet. Is eating some fruit, states that she does eat processed foods, ice cream, cereal, eats out a lot, likes chinese food, does eat some veggies but not on a daily basis. Gym/ health club routine includes goes 2 days week, runs on the treadmill, does the leg machines and the other machines in the gym. She generally feels well. She reports sleeping somewhat poorly, has trouble staying off her phone at night. Often will not go to sleep until 2 am. She does not have additional problems to discuss today.    Most recent fall risk assessment:     No data to display           Most recent depression screenings:    07/02/2024    1:09 PM 08/12/2022   11:02 AM  PHQ 2/9 Scores  PHQ - 2 Score 0 0  PHQ- 9 Score  0    Vision: does wear glasses, goes to vision works, no current vision issues  and Dental: No current dental problems and No regular dental care  hasn't seen a dentist in several years.   Patient Active Problem List   Diagnosis Date Noted   Smoker unmotivated to quit 12/30/2013   Morning headache 12/30/2013   PCOS (polycystic ovarian syndrome) 01/01/2013   General medical examination 10/15/2011   Obesity 10/15/2011      Patient Care Team: Ozell Heron HERO, MD as PCP - General (Family Medicine)   Outpatient Medications Prior to Visit  Medication Sig   naproxen  (NAPROSYN ) 500 MG tablet Take 1 tablet (500 mg total) by mouth 2 (two) times daily.   No facility-administered medications prior to visit.    Review of Systems  HENT:  Negative for hearing loss.   Eyes:  Negative for blurred vision.  Respiratory:  Negative for shortness of breath.   Cardiovascular:  Negative for  chest pain.  Gastrointestinal: Negative.   Genitourinary: Negative.   Musculoskeletal:  Negative for back pain.  Neurological:  Negative for headaches.  Psychiatric/Behavioral:  Negative for depression.        Objective:     BP 120/78   Pulse 80   Temp 98.2 F (36.8 C)   Ht 5' 4 (1.626 m)   Wt (!) 315 lb 6.4 oz (143.1 kg)   SpO2 99%   BMI 54.14 kg/m    Physical Exam Vitals reviewed.  Constitutional:      Appearance: Normal appearance. She is well-groomed. She is morbidly obese.  HENT:     Right Ear: Tympanic membrane and ear canal normal.     Left Ear: Tympanic membrane and ear canal normal.     Mouth/Throat:     Mouth: Mucous membranes are moist.     Pharynx: No posterior oropharyngeal erythema.  Eyes:     Conjunctiva/sclera: Conjunctivae normal.  Neck:     Thyroid: No thyromegaly.  Cardiovascular:     Rate and Rhythm: Normal rate and regular rhythm.     Pulses: Normal pulses.     Heart sounds: S1 normal and S2 normal.  Pulmonary:     Effort: Pulmonary effort is normal.     Breath sounds: Normal breath  sounds and air entry.  Abdominal:     General: Abdomen is flat. Bowel sounds are normal.     Palpations: Abdomen is soft.  Musculoskeletal:     Right lower leg: No edema.     Left lower leg: No edema.  Lymphadenopathy:     Cervical: No cervical adenopathy.  Neurological:     Mental Status: She is alert and oriented to person, place, and time. Mental status is at baseline.     Gait: Gait is intact.  Psychiatric:        Mood and Affect: Mood and affect normal.        Speech: Speech normal.        Behavior: Behavior normal.        Judgment: Judgment normal.      No results found for any visits on 07/02/24.     Assessment & Plan:    Routine Health Maintenance and Physical Exam  Immunization History  Administered Date(s) Administered   PFIZER(Purple Top)SARS-COV-2 Vaccination 05/29/2020, 06/19/2020    Health Maintenance  Topic Date Due   Fecal  DNA (Cologuard)  Never done   COVID-19 Vaccine (3 - 2025-26 season) 07/18/2024 (Originally 06/07/2024)   Zoster Vaccines- Shingrix (1 of 2) 10/01/2024 (Originally 12/08/2022)   Influenza Vaccine  01/04/2025 (Originally 05/07/2024)   DTaP/Tdap/Td (1 - Tdap) 07/02/2025 (Originally 12/08/1991)   Pneumococcal Vaccine: 50+ Years (1 of 2 - PCV) 07/02/2025 (Originally 12/08/1991)   Hepatitis B Vaccines 19-59 Average Risk (1 of 3 - 19+ 3-dose series) 07/02/2025 (Originally 12/08/1991)   Mammogram  07/02/2025 (Originally 01/19/2015)   Hepatitis C Screening  07/02/2025 (Originally 12/08/1990)   Cervical Cancer Screening (HPV/Pap Cotest)  10/09/2025   HIV Screening  Completed   HPV VACCINES  Aged Out   Meningococcal B Vaccine  Aged Out    Discussed health benefits of physical activity, and encouraged her to engage in regular exercise appropriate for her age and condition.  Breast cancer screening by mammogram -     3D Screening Mammogram, Left and Right; Future  Class 3 severe obesity without serious comorbidity with body mass index (BMI) of 50.0 to 59.9 in adult, unspecified obesity type -     Comprehensive metabolic panel with GFR; Future -     CBC with Differential/Platelet; Future -     TSH; Future -     Hemoglobin A1c; Future  Colon cancer screening -     Cologuard  Routine general medical examination at a health care facility  General physical exam findings are normal today. I reviewed the patient's preventative testing, immunizations, and lifestyle habits. I made appropriate recommendations and placed orders for the appropriate tests and/or vaccinations. I counseled the patient on the CDC's recommendations for healthy exercise and diet. I counseled the patient on healthy sleep habits and stress management. Handouts to reinforce the counseling were given at the conclusion of the visit.    Return in 1 year (on 07/02/2025).     Heron CHRISTELLA Sharper, MD

## 2024-07-03 LAB — COMPREHENSIVE METABOLIC PANEL WITH GFR
ALT: 12 IU/L (ref 0–32)
AST: 25 IU/L (ref 0–40)
Albumin: 4.3 g/dL (ref 3.8–4.9)
Alkaline Phosphatase: 120 IU/L (ref 49–135)
BUN/Creatinine Ratio: 7 — ABNORMAL LOW (ref 9–23)
BUN: 5 mg/dL — ABNORMAL LOW (ref 6–24)
Bilirubin Total: 0.6 mg/dL (ref 0.0–1.2)
CO2: 21 mmol/L (ref 20–29)
Calcium: 9 mg/dL (ref 8.7–10.2)
Chloride: 102 mmol/L (ref 96–106)
Creatinine, Ser: 0.72 mg/dL (ref 0.57–1.00)
Globulin, Total: 2.9 g/dL (ref 1.5–4.5)
Glucose: 91 mg/dL (ref 70–99)
Potassium: 3.8 mmol/L (ref 3.5–5.2)
Sodium: 142 mmol/L (ref 134–144)
Total Protein: 7.2 g/dL (ref 6.0–8.5)
eGFR: 101 mL/min/1.73 (ref >59)

## 2024-07-03 LAB — CBC WITH DIFFERENTIAL/PLATELET
Basophils Absolute: 0 x10E3/uL (ref 0.0–0.2)
Basos: 1 %
EOS (ABSOLUTE): 0.1 x10E3/uL (ref 0.0–0.4)
Eos: 1 %
Hematocrit: 47.8 % — ABNORMAL HIGH (ref 34.0–46.6)
Hemoglobin: 15.7 g/dL (ref 11.1–15.9)
Immature Grans (Abs): 0 x10E3/uL (ref 0.0–0.1)
Immature Granulocytes: 0 %
Lymphocytes Absolute: 2.5 x10E3/uL (ref 0.7–3.1)
Lymphs: 33 %
MCH: 28.4 pg (ref 26.6–33.0)
MCHC: 32.8 g/dL (ref 31.5–35.7)
MCV: 86 fL (ref 79–97)
Monocytes Absolute: 0.5 x10E3/uL (ref 0.1–0.9)
Monocytes: 6 %
Neutrophils Absolute: 4.6 x10E3/uL (ref 1.4–7.0)
Neutrophils: 59 %
Platelets: 255 x10E3/uL (ref 150–450)
RBC: 5.53 x10E6/uL — ABNORMAL HIGH (ref 3.77–5.28)
RDW: 13.5 % (ref 11.7–15.4)
WBC: 7.7 x10E3/uL (ref 3.4–10.8)

## 2024-07-03 LAB — TSH: TSH: 2.69 u[IU]/mL (ref 0.450–4.500)

## 2024-07-03 LAB — HEMOGLOBIN A1C
Est. average glucose Bld gHb Est-mCnc: 100 mg/dL
Hgb A1c MFr Bld: 5.1 % (ref 4.8–5.6)

## 2024-07-06 ENCOUNTER — Ambulatory Visit: Payer: Self-pay | Admitting: Family Medicine

## 2024-09-07 ENCOUNTER — Encounter: Payer: Self-pay | Admitting: Family Medicine

## 2024-09-07 ENCOUNTER — Ambulatory Visit: Payer: Self-pay | Admitting: Family Medicine

## 2024-09-07 VITALS — BP 112/64 | HR 67 | Temp 97.8°F | Ht 64.0 in | Wt 318.3 lb

## 2024-09-07 DIAGNOSIS — H9192 Unspecified hearing loss, left ear: Secondary | ICD-10-CM

## 2024-09-07 MED ORDER — PREDNISONE 20 MG PO TABS: ORAL_TABLET | ORAL | 0 refills | Status: AC

## 2024-09-07 NOTE — Progress Notes (Signed)
 Established Patient Office Visit  Subjective   Patient ID: [name], female    DOB: December 06, 1970  Age: 53 y.o. MRN: [id number]  Chief Complaint  Patient presents with   Hearing Problem    Patient complains of buzzing sound in the left ear 8 days ago, tried Ibuprofen with no relief,  hearing loss 6 days ago    HPI  Discussed the use of AI scribe software for clinical note transcription with the patient, who gave verbal consent to proceed.  History of Present Illness   [name] is a 52 year old female who presents with sudden onset left-sided hearing loss and tinnitus.  Last Sunday, while watching TV, she developed sudden persistent buzzing in her left ear with associated left-sided hearing loss. The buzzing was severe enough to disrupt sleep that night, then decreased in intensity by Monday and Tuesday but persisted. On Wednesday she realized she could not hear from her left ear during routine activity. Driving and loud environments worsen the buzzing, and headphones provide some relief.  She denies recent illness, fever, chills, nasal congestion, dizziness, nausea, vomiting, vertigo, facial numbness, or other neurologic symptoms. She has not started any new medications and is not on regular medications.  She has not noticed persistent pressure or fullness, though her ears felt clogged while driving to the appointment. She denies any room spinning or dizziness, no vertigo.     Current Outpatient Medications  Medication Instructions   predniSONE (DELTASONE) 20 MG tablet Take 3 tablets (60 mg total) by mouth daily with breakfast for 10 days, THEN 2 tablets (40 mg total) daily with breakfast for 3 days, THEN 1 tablet (20 mg total) daily with breakfast for 3 days, THEN 0.5 tablets (10 mg total) daily with breakfast for 3 days.    Patient Active Problem List   Diagnosis Date Noted   Smoker unmotivated to quit 12/30/2013   Morning headache 12/30/2013   PCOS (polycystic  ovarian syndrome) 01/01/2013   General medical examination 10/15/2011   Obesity 10/15/2011     Review of Systems  All other systems reviewed and are negative.     Objective:     BP 112/64   Pulse 67   Temp 97.8 F (36.6 C) (Oral)   Ht 5' 4 (1.626 m)   Wt (!) 318 lb 4.8 oz (144.4 kg)   SpO2 98%   BMI 54.64 kg/m    Physical Exam Vitals reviewed.  Constitutional:      Appearance: Normal appearance. She is morbidly obese.  HENT:     Right Ear: Tympanic membrane normal. There is no impacted cerumen.     Left Ear: Tympanic membrane normal. There is no impacted cerumen.     Nose: No congestion or rhinorrhea.     Mouth/Throat:     Mouth: Mucous membranes are moist.     Pharynx: No posterior oropharyngeal erythema.  Eyes:     Extraocular Movements: Extraocular movements intact.     Conjunctiva/sclera: Conjunctivae normal.  Neck:     Thyroid: No thyromegaly.  Cardiovascular:     Rate and Rhythm: Normal rate and regular rhythm.     Heart sounds: Normal heart sounds. No murmur heard. Pulmonary:     Effort: Pulmonary effort is normal.     Breath sounds: Normal breath sounds. No wheezing.  Lymphadenopathy:     Cervical: No cervical adenopathy.  Neurological:     Mental Status: She is alert.      No results found  for any visits on 09/07/24.    The 10-year ASCVD risk score (Arnett DK, et al., 2019) is: 2.7%    Assessment & Plan:  Unilateral hearing loss, left -     Ambulatory referral to ENT -     predniSONE; Take 3 tablets (60 mg total) by mouth daily with breakfast for 10 days, THEN 2 tablets (40 mg total) daily with breakfast for 3 days, THEN 1 tablet (20 mg total) daily with breakfast for 3 days, THEN 0.5 tablets (10 mg total) daily with breakfast for 3 days.  Dispense: 40.5 tablet; Refill: 0 -     Sedimentation rate; Future -     C-reactive protein; Future   Assessment and Plan    Unilateral sensorineural hearing loss, left ear Acute onset of unilateral  sensorineural hearing loss in the left ear since last Sunday, with no improvement. No associated dizziness, vertigo, or recent illness. Differential diagnosis includes viral infection, vestibular schwannoma, and other causes of sudden sensorineural hearing loss. No evidence of ear infection or wax buildup on examination. Audiologist confirmed significant hearing loss on the left side. Immediate intervention is necessary to prevent permanent hearing loss. - Referred to ENT for urgent evaluation and further workup. - Prescribed prednisone 60 mg daily for 10 days, then taper, to reduce potential viral inflammation. - Ordered CRP and ESR to assess for inflammation. - Advised to attempt to pop ears at home. - Instructed to start prednisone this afternoon. - Advised to follow up with ENT even if hearing improves with steroids.        No follow-ups on file.    Heron CHRISTELLA Sharper, MD

## 2024-09-08 ENCOUNTER — Encounter (INDEPENDENT_AMBULATORY_CARE_PROVIDER_SITE_OTHER): Payer: Self-pay

## 2024-09-08 ENCOUNTER — Ambulatory Visit: Payer: Self-pay | Admitting: Family Medicine

## 2024-09-08 LAB — SEDIMENTATION RATE: Sed Rate: 48 mm/h — ABNORMAL HIGH (ref 0–40)

## 2024-09-08 LAB — C-REACTIVE PROTEIN: CRP: 11 mg/L — ABNORMAL HIGH (ref 0–10)

## 2024-09-23 ENCOUNTER — Ambulatory Visit (INDEPENDENT_AMBULATORY_CARE_PROVIDER_SITE_OTHER): Payer: Self-pay | Admitting: Physician Assistant

## 2024-09-23 ENCOUNTER — Encounter (INDEPENDENT_AMBULATORY_CARE_PROVIDER_SITE_OTHER): Payer: Self-pay | Admitting: Physician Assistant

## 2024-09-23 VITALS — BP 141/76 | HR 68 | Temp 98.2°F | Ht 63.5 in | Wt 318.0 lb

## 2024-09-23 DIAGNOSIS — H9042 Sensorineural hearing loss, unilateral, left ear, with unrestricted hearing on the contralateral side: Secondary | ICD-10-CM

## 2024-09-23 DIAGNOSIS — H9122 Sudden idiopathic hearing loss, left ear: Secondary | ICD-10-CM

## 2024-09-23 NOTE — Progress Notes (Signed)
 Dear Dr. Ozell, Here is my assessment for our mutual patient, [name]. Thank you for allowing me the opportunity to care for your patient. Please do not hesitate to contact me should you have any other questions. Sincerely, Chyrl Cohen PA-C  Otolaryngology Clinic Note Referring provider: Dr. Ozell HPI:  [name] is a 53 y.o. female kindly referred by Dr. Ozell   Discussed the use of AI scribe software for clinical note transcription with the patient, who gave verbal consent to proceed.  History of Present Illness   [name] is a 53 year old female who presents with sudden hearing loss in the left ear.    Approximately three weeks ago, she began experiencing a buzzing sound in her left ear, severe enough to disrupt her sleep. By the Wednesday before Thanksgiving (3 weeks ago)  she noticed significant hearing loss in her left ear, realizing she could not hear her niece on the phone unless she switched to her right ear. Initially, she attributed the issue to sinus problems as suggested by her mother, but did not take any medication for it.  After consulting with her primary care physician the Monday after Thanksgiving, no issues were found at that time. However, she has since experienced a persistent sound in her left ear, described as similar to the noise of a heating or air conditioning unit. Her hearing has slightly improved from complete loss to a whisper-like sound after starting prednisone  on December 5th. She is currently still on prednisone , with three pills remaining from a 40-pill prescription.  In addition to the hearing issues, she reports a burning sensation in her throat and lips upon waking, which started in September or October. This sensation is not painful and subsides quickly. She also notes changes in her taste, with food not tasting the same, and a period of hoarseness in her voice. No ear pain, nasal congestion, or sinus issues. She has no history of  ear surgery or trauma, although she listens to loud music and uses headphones frequently. She has not started any new medications recently.  No dizziness, ear pain, nasal congestion, or sinus issues. No new medications around the time of symptom onset.  No previous trauma.  She does note that she listens to music very loud.  No nasal congestion or sinus symptoms today.     Independent Review of Additional Tests or Records:  None   PMH/Meds/All/SocHx/FamHx/ROS:   Past Medical History:  Diagnosis Date   Anemia    Blood in stool    Chicken pox    Endometrial polyp 2003 and 2005   Menorrhagia    Morbid obesity (HCC)    PCOS (polycystic ovarian syndrome)    Polyp cervix removed twice     Past Surgical History:  Procedure Laterality Date   Uterine polyp excision     [facility]    Family History  Problem Relation Age of Onset   Hypertension Mother    Hypertension Maternal Grandmother    Alzheimer's disease Maternal Grandmother    Alzheimer's disease Father 17     Social Connections: Not on file     Current Medications[1]   Physical Exam:   BP (!) 141/76 (BP Location: Left Wrist, Patient Position: Sitting, Cuff Size: Normal)   Pulse 68   Temp 98.2 F (36.8 C) (Oral)   Ht 5' 3.5 (1.613 m)   Wt (!) 318 lb (144.2 kg)   SpO2 97%   BMI 55.45 kg/m   Pertinent Findings  CN II-XII grossly intact Bilateral EAC clear and TM intact with well pneumatized middle ear spaces Weber 512: equal Rinne 512: AC > BC b/l  Anterior rhinoscopy: Septum midline; bilateral inferior turbinates with mild hypertrophy No lesions of oral cavity/oropharynx; dentition within normal limits No obviously palpable neck masses/lymphadenopathy/thyromegaly No respiratory distress or stridor   Seprately Identifiable Procedures:  None  Impression & Plans:  [name] is a 53 y.o. female with the following   Assessment and Plan    Sudden sensorineural hearing loss, left ear  Approximate  3 weeks ago she had acute hearing loss on the left side.  Her hearing has improved but is not back to her baseline.  Physical exam shows no significant findings.  Originally she did localize to the right but repeat Weber showed no localization.  No known identifiable cause of the hearing loss.  She is currently taking prednisone  she will finish the course as directed.  She will have stat audiological evaluation, I will call her with the results and order MR IAC if there is asymmetric sensorineural hearing loss.  Advised her to reach out to the office immediately if she develops any new or worsening signs or symptoms in the meantime.  The patient verbalized understanding and agreement to today's plan had no further questions or concerns.          - f/u phone call after audiological evaluation   Thank you for allowing me the opportunity to care for your patient. Please do not hesitate to contact me should you have any other questions.  Sincerely, Chyrl Cohen PA-C [facility] Phone: [phone] Fax: [phone]  09/23/2024, 2:36 PM        [1]  Current Outpatient Medications:    predniSONE  (DELTASONE ) 20 MG tablet, Take 3 tablets (60 mg total) by mouth daily with breakfast for 10 days, THEN 2 tablets (40 mg total) daily with breakfast for 3 days, THEN 1 tablet (20 mg total) daily with breakfast for 3 days, THEN 0.5 tablets (10 mg total) daily with breakfast for 3 days., Disp: 40.5 tablet, Rfl: 0

## 2024-09-24 NOTE — Addendum Note (Signed)
 Addended byBETHA COHEN, Elli Groesbeck on: 09/24/2024 08:37 AM   Modules accepted: Orders

## 2024-09-27 ENCOUNTER — Encounter (INDEPENDENT_AMBULATORY_CARE_PROVIDER_SITE_OTHER): Payer: Self-pay
